# Patient Record
Sex: Male | Born: 1970 | Race: White | Hispanic: No | Marital: Married | Smoking: Never smoker
Health system: Southern US, Community
[De-identification: ages and names within clinical notes are randomized; demographics above are authoritative.]

## PROBLEM LIST (undated history)

## (undated) DIAGNOSIS — K219 Gastro-esophageal reflux disease without esophagitis: Secondary | ICD-10-CM

## (undated) DIAGNOSIS — F419 Anxiety disorder, unspecified: Secondary | ICD-10-CM

## (undated) DIAGNOSIS — F32A Depression, unspecified: Secondary | ICD-10-CM

## (undated) DIAGNOSIS — R112 Nausea with vomiting, unspecified: Secondary | ICD-10-CM

## (undated) DIAGNOSIS — E119 Type 2 diabetes mellitus without complications: Secondary | ICD-10-CM

## (undated) DIAGNOSIS — R519 Headache, unspecified: Secondary | ICD-10-CM

## (undated) DIAGNOSIS — G4730 Sleep apnea, unspecified: Secondary | ICD-10-CM

## (undated) DIAGNOSIS — G8929 Other chronic pain: Secondary | ICD-10-CM

## (undated) DIAGNOSIS — T8859XA Other complications of anesthesia, initial encounter: Secondary | ICD-10-CM

## (undated) DIAGNOSIS — F329 Major depressive disorder, single episode, unspecified: Secondary | ICD-10-CM

## (undated) DIAGNOSIS — Z9889 Other specified postprocedural states: Secondary | ICD-10-CM

## (undated) DIAGNOSIS — M549 Dorsalgia, unspecified: Secondary | ICD-10-CM

## (undated) DIAGNOSIS — T4145XA Adverse effect of unspecified anesthetic, initial encounter: Secondary | ICD-10-CM

## (undated) DIAGNOSIS — R51 Headache: Secondary | ICD-10-CM

## (undated) HISTORY — PX: COLONOSCOPY: SHX174

## (undated) HISTORY — PX: CHOLECYSTECTOMY: SHX55

## (undated) HISTORY — PX: KNEE SURGERY: SHX244

## (undated) HISTORY — PX: WISDOM TOOTH EXTRACTION: SHX21

---

## 2016-10-02 ENCOUNTER — Encounter (HOSPITAL_BASED_OUTPATIENT_CLINIC_OR_DEPARTMENT_OTHER): Payer: Self-pay | Admitting: *Deleted

## 2016-10-02 ENCOUNTER — Emergency Department (HOSPITAL_BASED_OUTPATIENT_CLINIC_OR_DEPARTMENT_OTHER): Payer: Managed Care, Other (non HMO)

## 2016-10-02 ENCOUNTER — Inpatient Hospital Stay (HOSPITAL_BASED_OUTPATIENT_CLINIC_OR_DEPARTMENT_OTHER)
Admission: EM | Admit: 2016-10-02 | Discharge: 2016-10-05 | DRG: 103 | Disposition: A | Discharge status: Home / Self Care | Payer: Managed Care, Other (non HMO) | Attending: Family Medicine | Admitting: Family Medicine

## 2016-10-02 DIAGNOSIS — I10 Essential (primary) hypertension: Secondary | ICD-10-CM | POA: Diagnosis present

## 2016-10-02 DIAGNOSIS — G43909 Migraine, unspecified, not intractable, without status migrainosus: Secondary | ICD-10-CM | POA: Diagnosis not present

## 2016-10-02 DIAGNOSIS — E781 Pure hyperglyceridemia: Secondary | ICD-10-CM | POA: Diagnosis present

## 2016-10-02 DIAGNOSIS — R609 Edema, unspecified: Secondary | ICD-10-CM

## 2016-10-02 DIAGNOSIS — R600 Localized edema: Secondary | ICD-10-CM | POA: Diagnosis present

## 2016-10-02 DIAGNOSIS — G459 Transient cerebral ischemic attack, unspecified: Secondary | ICD-10-CM

## 2016-10-02 DIAGNOSIS — E876 Hypokalemia: Secondary | ICD-10-CM | POA: Diagnosis present

## 2016-10-02 DIAGNOSIS — E662 Morbid (severe) obesity with alveolar hypoventilation: Secondary | ICD-10-CM | POA: Diagnosis present

## 2016-10-02 DIAGNOSIS — R29898 Other symptoms and signs involving the musculoskeletal system: Secondary | ICD-10-CM

## 2016-10-02 DIAGNOSIS — R030 Elevated blood-pressure reading, without diagnosis of hypertension: Secondary | ICD-10-CM

## 2016-10-02 DIAGNOSIS — E1165 Type 2 diabetes mellitus with hyperglycemia: Secondary | ICD-10-CM | POA: Diagnosis present

## 2016-10-02 DIAGNOSIS — M6281 Muscle weakness (generalized): Secondary | ICD-10-CM

## 2016-10-02 DIAGNOSIS — R29701 NIHSS score 1: Secondary | ICD-10-CM | POA: Diagnosis present

## 2016-10-02 DIAGNOSIS — I639 Cerebral infarction, unspecified: Secondary | ICD-10-CM | POA: Diagnosis present

## 2016-10-02 DIAGNOSIS — Z8249 Family history of ischemic heart disease and other diseases of the circulatory system: Secondary | ICD-10-CM

## 2016-10-02 DIAGNOSIS — R4781 Slurred speech: Secondary | ICD-10-CM | POA: Diagnosis present

## 2016-10-02 DIAGNOSIS — Z6841 Body Mass Index (BMI) 40.0 and over, adult: Secondary | ICD-10-CM

## 2016-10-02 DIAGNOSIS — R739 Hyperglycemia, unspecified: Secondary | ICD-10-CM

## 2016-10-02 DIAGNOSIS — K219 Gastro-esophageal reflux disease without esophagitis: Secondary | ICD-10-CM | POA: Diagnosis present

## 2016-10-02 DIAGNOSIS — Z833 Family history of diabetes mellitus: Secondary | ICD-10-CM

## 2016-10-02 DIAGNOSIS — R079 Chest pain, unspecified: Secondary | ICD-10-CM

## 2016-10-02 HISTORY — DX: Morbid (severe) obesity due to excess calories: E66.01

## 2016-10-02 LAB — DIFFERENTIAL
BASOS PCT: 0 %
Basophils Absolute: 0 10*3/uL (ref 0.0–0.1)
EOS ABS: 0.1 10*3/uL (ref 0.0–0.7)
Eosinophils Relative: 1 %
Lymphocytes Relative: 27 %
Lymphs Abs: 2.5 10*3/uL (ref 0.7–4.0)
MONO ABS: 0.7 10*3/uL (ref 0.1–1.0)
MONOS PCT: 8 %
Neutro Abs: 6 10*3/uL (ref 1.7–7.7)
Neutrophils Relative %: 64 %

## 2016-10-02 LAB — PROTIME-INR
INR: 1.02
PROTHROMBIN TIME: 13.4 s (ref 11.4–15.2)

## 2016-10-02 LAB — COMPREHENSIVE METABOLIC PANEL
ALBUMIN: 4 g/dL (ref 3.5–5.0)
ALK PHOS: 67 U/L (ref 38–126)
ALT: 47 U/L (ref 17–63)
ANION GAP: 8 (ref 5–15)
AST: 36 U/L (ref 15–41)
BILIRUBIN TOTAL: 0.9 mg/dL (ref 0.3–1.2)
BUN: 15 mg/dL (ref 6–20)
CALCIUM: 8.7 mg/dL — AB (ref 8.9–10.3)
CO2: 28 mmol/L (ref 22–32)
Chloride: 101 mmol/L (ref 101–111)
Creatinine, Ser: 1.09 mg/dL (ref 0.61–1.24)
GFR calc non Af Amer: >60 mL/min (ref >60)
Glucose, Bld: 269 mg/dL — ABNORMAL HIGH (ref 65–99)
Potassium: 3.2 mmol/L — ABNORMAL LOW (ref 3.5–5.1)
SODIUM: 137 mmol/L (ref 135–145)
TOTAL PROTEIN: 8 g/dL (ref 6.5–8.1)

## 2016-10-02 LAB — CBC
HEMATOCRIT: 43.9 % (ref 39.0–52.0)
HEMOGLOBIN: 15.4 g/dL (ref 13.0–17.0)
MCH: 30.6 pg (ref 26.0–34.0)
MCHC: 35.1 g/dL (ref 30.0–36.0)
MCV: 87.3 fL (ref 78.0–100.0)
Platelets: 209 10*3/uL (ref 150–400)
RBC: 5.03 MIL/uL (ref 4.22–5.81)
RDW: 12.4 % (ref 11.5–15.5)
WBC: 9.4 10*3/uL (ref 4.0–10.5)

## 2016-10-02 LAB — ETHANOL: Alcohol, Ethyl (B): <5 mg/dL (ref <5)

## 2016-10-02 LAB — APTT: aPTT: 28 seconds (ref 24–36)

## 2016-10-02 LAB — TROPONIN I

## 2016-10-02 LAB — CBG MONITORING, ED: GLUCOSE-CAPILLARY: 246 mg/dL — AB (ref 65–99)

## 2016-10-02 MED ORDER — NITROGLYCERIN 0.4 MG SL SUBL: 0.4000 mg | SUBLINGUAL_TABLET | SUBLINGUAL | Status: DC | PRN

## 2016-10-02 MED ORDER — ASPIRIN 81 MG PO CHEW
324.0000 mg | CHEWABLE_TABLET | Freq: Once | ORAL | Status: AC
  Administered 2016-10-03: 324 mg via ORAL
  Filled 2016-10-02: qty 4

## 2016-10-02 NOTE — ED Notes (Addendum)
Patient transported to CT with nurse transport on monitor.

## 2016-10-02 NOTE — ED Triage Notes (Signed)
Left arm weakness and numbness as well as "pt being out of it and not himself and some slurred speech".  Wife states that pt was normal at 8pm and when she returned at 8:30 he appeared out of it and his speech was slurred and he had weakness and numbness in his left arm.  Pt is alert and oriented with weakness in left arm

## 2016-10-02 NOTE — ED Notes (Signed)
Patient transported to CT 

## 2016-10-02 NOTE — ED Notes (Signed)
Code stroke called now by Dr. Clayborne DanaMesner.

## 2016-10-02 NOTE — Consult Note (Signed)
Admission H&P    Chief Complaint: Weakness and numbness of left upper extremity.  HPI: Ernest Franco is an 45 y.o. male with a history of hypertension and obesity presenting with new onset weakness and numbness of left forearm and hand of sudden onset. He was last known well at 10:00 PM tonight. He has no previous history of stroke nor TIA. He has not been on antiplatelet therapy. No facial droop has been described and speech is been unchanged. He said no symptoms involving left side of his face nor left lower extremity. CT scan of his head showed no acute intracranial abnormality. NIH stroke score was 1 with numbness involving left forearm and hand. There was no drift of his left upper extremity, but he appeared to have distal left upper extremity weakness.  LSN: 10:00 PM on 10/02/2016 tPA Given: No: Minimal deficits. mRankin:  Past Medical History:  Diagnosis Date  . Hypertension   . Obesity     Past Surgical History:  Procedure Laterality Date  . CHOLECYSTECTOMY    . KNEE SURGERY      No family history on file. Social History:  reports that he has never smoked. He has never used smokeless tobacco. He reports that he does not drink alcohol or use drugs.  Allergies: No Known Allergies  Medications: Preadmission medications were reviewed by me.  ROS: History obtained from the patient  General ROS: negative for - chills, fatigue, fever, night sweats, weight gain or weight loss Psychological ROS: negative for - behavioral disorder, hallucinations, memory difficulties, mood swings or suicidal ideation Ophthalmic ROS: negative for - blurry vision, double vision, eye pain or loss of vision ENT ROS: negative for - epistaxis, nasal discharge, oral lesions, sore throat, tinnitus or vertigo Allergy and Immunology ROS: negative for - hives or itchy/watery eyes Hematological and Lymphatic ROS: negative for - bleeding problems, bruising or swollen lymph nodes Endocrine ROS: negative for -  galactorrhea, hair pattern changes, polydipsia/polyuria or temperature intolerance Respiratory ROS: negative for - cough, hemoptysis, shortness of breath or wheezing Cardiovascular ROS: negative for - chest pain, dyspnea on exertion, edema or irregular heartbeat Gastrointestinal ROS: negative for - abdominal pain, diarrhea, hematemesis, nausea/vomiting or stool incontinence Genito-Urinary ROS: negative for - dysuria, hematuria, incontinence or urinary frequency/urgency Musculoskeletal ROS: negative for - joint swelling or muscular weakness Neurological ROS: as noted in HPI Dermatological ROS: negative for rash and skin lesion changes  Physical Examination: Blood pressure 101/56, pulse 79, temperature 98 F (36.7 C), temperature source Oral, resp. rate 11, height 5' 6"  (1.676 m), weight (!) 172.4 kg (380 lb), SpO2 100 %.  HEENT-  Normocephalic, no lesions, without obvious abnormality.  Normal external eye and conjunctiva.  Normal TM's bilaterally.  Normal auditory canals and external ears. Normal external nose, mucus membranes and septum.  Normal pharynx. Neck supple with no masses, nodes, nodules or enlargement. Cardiovascular - regular rate and rhythm, S1, S2 normal, no murmur, click, rub or gallop Lungs - chest clear, no wheezing, rales, normal symmetric air entry Abdomen - soft, non-tender; bowel sounds normal; no masses,  no organomegaly Extremities - no joint deformities, effusion, or inflammation  Neurologic Examination: Mental Status: Alert, oriented, no acute distress.  Speech fluent without evidence of aphasia. Able to follow commands without difficulty. Cranial Nerves: II-Visual fields were normal. III/IV/VI-Pupils were equal and reacted normally to light. Extraocular movements were full and conjugate.    V/VII-no facial numbness and no facial weakness. VIII-normal. X-normal speech and symmetrical palatal movement. XI: trapezius strength/neck  flexion strength normal  bilaterally XII-midline tongue extension with normal strength. Motor: 5/5 bilaterally with normal tone and bulk, including no drift of left upper extremity; strength of distal left upper extremity was difficult to assess because of possible poor effort versus moderately severe weakness. Sensory: Reduced perception of tactile sensation over left forearm and hand compared to the distal right upper extremity. Deep Tendon Reflexes: 1+ and symmetric. Plantars: Flexor bilaterally Cerebellar: Normal finger-to-nose testing. Carotid auscultation: Normal  Results for orders placed or performed during the hospital encounter of 10/02/16 (from the past 48 hour(s))  Troponin I     Status: None   Collection Time: 10/02/16 10:04 PM  Result Value Ref Range   Troponin I <0.03 <0.03 ng/mL  CBC     Status: None   Collection Time: 10/02/16 10:04 PM  Result Value Ref Range   WBC 9.4 4.0 - 10.5 K/uL   RBC 5.03 4.22 - 5.81 MIL/uL   Hemoglobin 15.4 13.0 - 17.0 g/dL   HCT 43.9 39.0 - 52.0 %   MCV 87.3 78.0 - 100.0 fL   MCH 30.6 26.0 - 34.0 pg   MCHC 35.1 30.0 - 36.0 g/dL   RDW 12.4 11.5 - 15.5 %   Platelets 209 150 - 400 K/uL  Comprehensive metabolic panel     Status: Abnormal   Collection Time: 10/02/16 10:04 PM  Result Value Ref Range   Sodium 137 135 - 145 mmol/L   Potassium 3.2 (L) 3.5 - 5.1 mmol/L   Chloride 101 101 - 111 mmol/L   CO2 28 22 - 32 mmol/L   Glucose, Bld 269 (H) 65 - 99 mg/dL   BUN 15 6 - 20 mg/dL   Creatinine, Ser 1.09 0.61 - 1.24 mg/dL   Calcium 8.7 (L) 8.9 - 10.3 mg/dL   Total Protein 8.0 6.5 - 8.1 g/dL   Albumin 4.0 3.5 - 5.0 g/dL   AST 36 15 - 41 U/L   ALT 47 17 - 63 U/L   Alkaline Phosphatase 67 38 - 126 U/L   Total Bilirubin 0.9 0.3 - 1.2 mg/dL   GFR calc non Af Amer >60 >60 mL/min   GFR calc Af Amer >60 >60 mL/min    Comment: (NOTE) The eGFR has been calculated using the CKD EPI equation. This calculation has not been validated in all clinical situations. eGFR's  persistently <60 mL/min signify possible Chronic Kidney Disease.    Anion gap 8 5 - 15  Ethanol     Status: None   Collection Time: 10/02/16 10:04 PM  Result Value Ref Range   Alcohol, Ethyl (B) <5 <5 mg/dL    Comment:        LOWEST DETECTABLE LIMIT FOR SERUM ALCOHOL IS 5 mg/dL FOR MEDICAL PURPOSES ONLY   Protime-INR     Status: None   Collection Time: 10/02/16 10:04 PM  Result Value Ref Range   Prothrombin Time 13.4 11.4 - 15.2 seconds   INR 1.02   APTT     Status: None   Collection Time: 10/02/16 10:04 PM  Result Value Ref Range   aPTT 28 24 - 36 seconds  Differential     Status: None   Collection Time: 10/02/16 10:04 PM  Result Value Ref Range   Neutrophils Relative % 64 %   Neutro Abs 6.0 1.7 - 7.7 K/uL   Lymphocytes Relative 27 %   Lymphs Abs 2.5 0.7 - 4.0 K/uL   Monocytes Relative 8 %   Monocytes Absolute 0.7 0.1 -  1.0 K/uL   Eosinophils Relative 1 %   Eosinophils Absolute 0.1 0.0 - 0.7 K/uL   Basophils Relative 0 %   Basophils Absolute 0.0 0.0 - 0.1 K/uL  POC CBG, ED     Status: Abnormal   Collection Time: 10/02/16 10:06 PM  Result Value Ref Range   Glucose-Capillary 246 (H) 65 - 99 mg/dL   Ct Head Wo Contrast  Result Date: 10/02/2016 CLINICAL DATA:  Left-sided weakness and slurred speech for 2.3 hours. EXAM: CT HEAD WITHOUT CONTRAST TECHNIQUE: Contiguous axial images were obtained from the base of the skull through the vertex without intravenous contrast. COMPARISON:  None. FINDINGS: Brain: No evidence of acute infarction, hemorrhage, hydrocephalus, extra-axial collection or mass lesion/mass effect. Vascular: No hyperdense vessel or unexpected calcification. Skull: Normal. Negative for fracture or focal lesion. Sinuses/Orbits: No acute finding. Other: None. IMPRESSION: No acute intracranial abnormality. Acute infarcts may be CT occult in the first 24 hours. These results were called by telephone at the time of interpretation on 10/02/2016 at 10:32 pm to Dr. Merrily Pew , who verbally acknowledged these results. Electronically Signed   By: Ashley Royalty M.D.   On: 10/02/2016 22:32    Assessment: 45 y.o. male with multiple risk factors for stroke presenting with possible TIA or small vessel right MCA territory ischemic infarction. There may be psychophysiologic factors contributing to this patient's symptomatology, as well.  Stroke Risk Factors - diabetes mellitus and hypertension  Plan: 1. HgbA1c, fasting lipid panel 2. MRI, MRA  of the brain without contrast 3. PT consult, OT consult, Speech consult 4. Echocardiogram 5. Carotid dopplers 6. Prophylactic therapy-Antiplatelet med: Aspirin  7. Risk factor modification 8. Telemetry monitoring 9. Hypercoagulopathy panel  C.R. Nicole Kindred, MD [facility] [phone]  10/02/2016, 11:27 PM

## 2016-10-02 NOTE — ED Notes (Signed)
Pt arrived to ED with report of URI and CP, at beginning of triage found that pt had left sided hand weakness and numbness which was first noticed at 20:30, LSN 2000.  Some slurred speech.  EDP to room for eval.  Pt taken to CT and then EDP notifies that [facility]rolog[facility].  No order for TPA at this time.  Pt has 2 IV in place.  [facility] at bedside

## 2016-10-02 NOTE — ED Triage Notes (Signed)
Received patient via [facility], Dr. Roseanne RenoStewart neurology at bedside at this time.

## 2016-10-02 NOTE — ED Provider Notes (Signed)
[facility] Provider Note   CSN: [id number] Arrival date & time: 10/02/16  2156  By signing my name below, I, Levon Hedger, attest that this documentation has been prepared under the direction and in the presence of Marily Memos, MD . Electronically Signed: Levon Hedger, Scribe. 10/02/2016. 10:17 PM.   History   Chief Complaint Chief Complaint  Patient presents with  . Weakness   HPI Ernest Franco is a left hand dominant 45 y.o. male with hx of HTN who presents to the Emergency Department complaining of sudden onset left sided weakness tonight PTA. He was last seen normal at 8 pm, and when she returned at 8:30 he was experiencing symptoms. Wife and pt note associated slurred speech, left sided numbness, chest pain, and chills. He has never had anything like this before. No hx of DM, MI, or TIA. He is not on any blood thinners. No recent head surgery. No alcohol or drug use today. Pt is a nonsmoker.  The history is provided by the patient and the spouse. No language interpreter was used.    Past Medical History:  Diagnosis Date  . Hypertension   . Obesity     There are no active problems to display for this patient.   Past Surgical History:  Procedure Laterality Date  . CHOLECYSTECTOMY    . KNEE SURGERY         Home Medications    Prior to Admission medications   Not on File    Family History No family history on file.  Social History Social History  Substance Use Topics  . Smoking status: Never Smoker  . Smokeless tobacco: Never Used  . Alcohol use No     Allergies   Review of patient's allergies indicates no known allergies.  Review of Systems Review of Systems  Constitutional: Positive for chills.  Neurological: Positive for facial asymmetry, speech difficulty and numbness.  Hematological: Does not bruise/bleed easily.  All other systems reviewed and are negative.  Physical Exam Updated Vital Signs BP 101/56 (BP Location: Right Arm)    Pulse 79   Temp 98 F (36.7 C) (Oral)   Resp 11   Ht 5\' 6"  (1.676 m)   Wt (!) 380 lb (172.4 kg)   SpO2 100%   BMI 61.33 kg/m   Physical Exam  Constitutional: He is oriented to person, place, and time. He appears well-developed and well-nourished. No distress.  HENT:  Head: Normocephalic and atraumatic.  Eyes: Conjunctivae are normal.  Cardiovascular: Normal rate and regular rhythm.  Exam reveals no friction rub.   No murmur heard. Pulmonary/Chest: Effort normal. He has no wheezes. He has no rales.  Abdominal: He exhibits no distension.  Neurological: He is alert and oriented to person, place, and time.  Normal strength and sensation to lower extremities and right arm. Left arm flaccid. Slight left droop of smile; per family he has slurred speech   Skin: Skin is warm and dry.  Psychiatric: He has a normal mood and affect.  Nursing note and vitals reviewed.  ED Treatments / Results  DIAGNOSTIC STUDIES:  Oxygen Saturation is 100% on RA, normal by my interpretation.    COORDINATION OF CARE:  10:09 PM Discussed treatment plan with pt at bedside and pt agreed to plan.   Labs (all labs ordered are listed, but only abnormal results are displayed) Labs Reviewed  COMPREHENSIVE METABOLIC PANEL - Abnormal; Notable for the following:       Result Value   Potassium 3.2 (*)  Glucose, Bld 269 (*)    Calcium 8.7 (*)    All other components within normal limits  CBG MONITORING, ED - Abnormal; Notable for the following:    Glucose-Capillary 246 (*)    All other components within normal limits  TROPONIN I  CBC  ETHANOL  PROTIME-INR  APTT  DIFFERENTIAL  URINE RAPID DRUG SCREEN, HOSP PERFORMED  URINALYSIS, ROUTINE W REFLEX MICROSCOPIC (NOT AT [facility])    EKG  EKG Interpretation  Date/Time:  Tuesday October 02 2016 22:10:14 EDT Ventricular Rate:  84 PR Interval:    QRS Duration: 104 QT Interval:  382 QTC Calculation: 452 R Axis:   65 Text Interpretation:  Sinus rhythm ST  elev, probable normal early repol pattern No acute changes No significant change since last tracing Confirmed by Rhunette CroftNANAVATI, MD, Janey GentaANKIT [phone] on 10/02/2016 11:06:28 PM      Radiology Ct Head Wo Contrast  Result Date: 10/02/2016 CLINICAL DATA:  Left-sided weakness and slurred speech for 2.3 hours. EXAM: CT HEAD WITHOUT CONTRAST TECHNIQUE: Contiguous axial images were obtained from the base of the skull through the vertex without intravenous contrast. COMPARISON:  None. FINDINGS: Brain: No evidence of acute infarction, hemorrhage, hydrocephalus, extra-axial collection or mass lesion/mass effect. Vascular: No hyperdense vessel or unexpected calcification. Skull: Normal. Negative for fracture or focal lesion. Sinuses/Orbits: No acute finding. Other: None. IMPRESSION: No acute intracranial abnormality. Acute infarcts may be CT occult in the first 24 hours. These results were called by telephone at the time of interpretation on 10/02/2016 at 10:32 pm to Dr. Marily MemosJASON Aja Bolander , who verbally acknowledged these results. Electronically Signed   By: Tollie Ethavid  Kwon M.D.   On: 10/02/2016 22:32    Procedures Procedures (including critical care time)  CRITICAL CARE Performed by: Marily MemosMesner, Tarik Teixeira Total critical care time: 35 minutes Critical care time was exclusive of separately billable procedures and treating other patients. Critical care was necessary to treat or prevent imminent or life-threatening deterioration. Critical care was time spent personally by me on the following activities: development of treatment plan with patient and/or surrogate as well as nursing, discussions with consultants, evaluation of patient's response to treatment, examination of patient, obtaining history from patient or surrogate, ordering and performing treatments and interventions, ordering and review of laboratory studies, ordering and review of radiographic studies, pulse oximetry and re-evaluation of patient's condition.   Medications  Ordered in ED Medications - No data to display   Initial Impression / Assessment and Plan / ED Course  I have reviewed the triage vital signs and the nursing notes.  Pertinent labs & imaging results that were available during my care of the patient were reviewed by me and considered in my medical decision making (see chart for details).  Clinical Course   45 yo M w/ acute onset of left handed weakness at 2030, LKW at 502000. No h/o same. Slurred speech per family. Slight facial droop on my exam.  Code stroke activated. [facility] and no tPA given until [facility], [facility] transported quickly.   Final Clinical Impressions(s) / ED Diagnoses   Final diagnoses:  Weakness of left upper extremity   New Prescriptions New Prescriptions   No medications on file   I personally performed the services described in this documentation, which was scribed in my presence. The recorded information has been reviewed and is accurate.     Marily MemosJason Danitra Payano, MD 10/02/16 [phone]

## 2016-10-02 NOTE — ED Notes (Signed)
[facility] arrived to this facility.

## 2016-10-02 NOTE — ED Notes (Signed)
MD at bedside. 

## 2016-10-02 NOTE — ED Notes (Signed)
[facility] leaving facility at this time.

## 2016-10-03 ENCOUNTER — Observation Stay (HOSPITAL_COMMUNITY): Payer: Managed Care, Other (non HMO)

## 2016-10-03 ENCOUNTER — Observation Stay (HOSPITAL_BASED_OUTPATIENT_CLINIC_OR_DEPARTMENT_OTHER): Payer: Managed Care, Other (non HMO)

## 2016-10-03 ENCOUNTER — Encounter (HOSPITAL_COMMUNITY): Payer: Self-pay | Admitting: Nurse Practitioner

## 2016-10-03 DIAGNOSIS — R079 Chest pain, unspecified: Secondary | ICD-10-CM | POA: Diagnosis not present

## 2016-10-03 DIAGNOSIS — G458 Other transient cerebral ischemic attacks and related syndromes: Secondary | ICD-10-CM

## 2016-10-03 DIAGNOSIS — R609 Edema, unspecified: Secondary | ICD-10-CM | POA: Diagnosis not present

## 2016-10-03 DIAGNOSIS — R739 Hyperglycemia, unspecified: Secondary | ICD-10-CM

## 2016-10-03 DIAGNOSIS — R030 Elevated blood-pressure reading, without diagnosis of hypertension: Secondary | ICD-10-CM | POA: Diagnosis not present

## 2016-10-03 DIAGNOSIS — M6281 Muscle weakness (generalized): Secondary | ICD-10-CM

## 2016-10-03 DIAGNOSIS — G459 Transient cerebral ischemic attack, unspecified: Secondary | ICD-10-CM | POA: Diagnosis not present

## 2016-10-03 LAB — VAS US CAROTID
LCCADDIAS: 18 cm/s
LCCAPSYS: 119 cm/s
LEFT ECA DIAS: -17 cm/s
LEFT VERTEBRAL DIAS: -10 cm/s
Left CCA dist sys: 87 cm/s
Left CCA prox dias: 25 cm/s
Left ICA dist dias: -26 cm/s
Left ICA dist sys: -67 cm/s
Left ICA prox dias: -25 cm/s
Left ICA prox sys: -104 cm/s
RCCADSYS: -59 cm/s
RCCAPDIAS: 18 cm/s
RIGHT ECA DIAS: -13 cm/s
RIGHT VERTEBRAL DIAS: -5 cm/s
Right CCA prox sys: 111 cm/s

## 2016-10-03 LAB — LIPID PANEL
Cholesterol: 166 mg/dL (ref 0–200)
HDL: 25 mg/dL — AB (ref >40)
LDL CALC: 98 mg/dL (ref 0–99)
TRIGLYCERIDES: 216 mg/dL — AB (ref <150)
Total CHOL/HDL Ratio: 6.6 RATIO
VLDL: 43 mg/dL — AB (ref 0–40)

## 2016-10-03 LAB — RAPID URINE DRUG SCREEN, HOSP PERFORMED
Amphetamines: NOT DETECTED
BARBITURATES: NOT DETECTED
Benzodiazepines: NOT DETECTED
COCAINE: NOT DETECTED
OPIATES: NOT DETECTED
TETRAHYDROCANNABINOL: NOT DETECTED

## 2016-10-03 LAB — GLUCOSE, CAPILLARY
GLUCOSE-CAPILLARY: 246 mg/dL — AB (ref 65–99)
GLUCOSE-CAPILLARY: 252 mg/dL — AB (ref 65–99)
Glucose-Capillary: 232 mg/dL — ABNORMAL HIGH (ref 65–99)
Glucose-Capillary: 196 mg/dL — ABNORMAL HIGH (ref 65–99)

## 2016-10-03 LAB — TROPONIN I

## 2016-10-03 LAB — ECHOCARDIOGRAM COMPLETE
Height: 66 in
Weight: 6080 oz

## 2016-10-03 LAB — ANTITHROMBIN III: AntiThromb III Func: 86 % (ref 75–120)

## 2016-10-03 MED ORDER — ASPIRIN 300 MG RE SUPP: 300.0000 mg | Freq: Every day | RECTAL | Status: DC

## 2016-10-03 MED ORDER — LORAZEPAM 2 MG/ML IJ SOLN
0.5000 mg | Freq: Once | INTRAMUSCULAR | Status: AC
  Administered 2016-10-03: 0.5 mg via INTRAVENOUS
  Filled 2016-10-03: qty 1

## 2016-10-03 MED ORDER — FUROSEMIDE 20 MG PO TABS
20.0000 mg | ORAL_TABLET | Freq: Two times a day (BID) | ORAL | Status: DC
  Administered 2016-10-03 – 2016-10-05 (×5): 20 mg via ORAL
  Filled 2016-10-03 (×5): qty 1

## 2016-10-03 MED ORDER — ATORVASTATIN CALCIUM 10 MG PO TABS
10.0000 mg | ORAL_TABLET | Freq: Every day | ORAL | Status: DC
  Administered 2016-10-03 – 2016-10-04 (×2): 10 mg via ORAL
  Filled 2016-10-03 (×2): qty 1

## 2016-10-03 MED ORDER — POTASSIUM CHLORIDE CRYS ER 20 MEQ PO TBCR
40.0000 meq | EXTENDED_RELEASE_TABLET | Freq: Every day | ORAL | Status: DC
  Administered 2016-10-03 – 2016-10-05 (×3): 40 meq via ORAL
  Filled 2016-10-03 (×3): qty 2

## 2016-10-03 MED ORDER — ASPIRIN 325 MG PO TABS
325.0000 mg | ORAL_TABLET | Freq: Every day | ORAL | Status: DC
  Administered 2016-10-03 – 2016-10-05 (×3): 325 mg via ORAL
  Filled 2016-10-03 (×3): qty 1

## 2016-10-03 MED ORDER — IOPAMIDOL (ISOVUE-370) INJECTION 76%
INTRAVENOUS | Status: AC
  Administered 2016-10-03: 50 mL
  Filled 2016-10-03: qty 50

## 2016-10-03 MED ORDER — MORPHINE SULFATE (PF) 2 MG/ML IV SOLN: 2.0000 mg | INTRAVENOUS | Status: DC | PRN

## 2016-10-03 MED ORDER — INSULIN ASPART 100 UNIT/ML ~~LOC~~ SOLN
0.0000 [IU] | Freq: Three times a day (TID) | SUBCUTANEOUS | Status: DC
  Administered 2016-10-03: 2 [IU] via SUBCUTANEOUS
  Administered 2016-10-03 – 2016-10-04 (×3): 3 [IU] via SUBCUTANEOUS
  Administered 2016-10-04: 2 [IU] via SUBCUTANEOUS
  Administered 2016-10-04: 3 [IU] via SUBCUTANEOUS
  Administered 2016-10-05: 2 [IU] via SUBCUTANEOUS

## 2016-10-03 MED ORDER — PANTOPRAZOLE SODIUM 40 MG PO TBEC
40.0000 mg | DELAYED_RELEASE_TABLET | Freq: Every day | ORAL | Status: DC
  Administered 2016-10-03 – 2016-10-05 (×3): 40 mg via ORAL
  Filled 2016-10-03 (×3): qty 1

## 2016-10-03 MED ORDER — ACETAMINOPHEN 500 MG PO TABS
1000.0000 mg | ORAL_TABLET | Freq: Once | ORAL | Status: AC
  Administered 2016-10-03: 1000 mg via ORAL
  Filled 2016-10-03: qty 2

## 2016-10-03 MED ORDER — HYDROCODONE-ACETAMINOPHEN 5-325 MG PO TABS
1.0000 | ORAL_TABLET | Freq: Four times a day (QID) | ORAL | Status: DC | PRN
  Administered 2016-10-03: 2 via ORAL
  Administered 2016-10-03: 1 via ORAL
  Administered 2016-10-04 – 2016-10-05 (×3): 2 via ORAL
  Filled 2016-10-03 (×2): qty 2
  Filled 2016-10-03: qty 1
  Filled 2016-10-03 (×2): qty 2

## 2016-10-03 MED ORDER — PERFLUTREN LIPID MICROSPHERE
1.0000 mL | INTRAVENOUS | Status: AC | PRN
  Administered 2016-10-03: 3 mL via INTRAVENOUS
  Filled 2016-10-03: qty 10

## 2016-10-03 MED ORDER — ACETAMINOPHEN 325 MG PO TABS
650.0000 mg | ORAL_TABLET | ORAL | Status: DC | PRN
  Filled 2016-10-03: qty 2

## 2016-10-03 MED ORDER — ACETAMINOPHEN 650 MG RE SUPP: 650.0000 mg | RECTAL | Status: DC | PRN

## 2016-10-03 NOTE — Consult Note (Signed)
CARDIOLOGY CONSULT NOTE   Patient ID: Ernest Franco MRN: [id number], DOB/AGE: [birth year]   Admit date: 10/02/2016 Date of Consult: 10/03/2016   Primary Physician: Cheral Bay, MD Primary Cardiologist: new  Pt. Profile  Ernest Franco is is a morbidly obese 45 year old Caucasian male with no significant past medical history or cardiac history presented with left arm weakness, slurred speech and facial drooping since the night of 10/3. He also complained of intermittent chest discomfort for the past several month period  Problem List  Past Medical History:  Diagnosis Date  . Essential hypertension   . Morbid obesity (HCC)     Past Surgical History:  Procedure Laterality Date  . CHOLECYSTECTOMY    . KNEE SURGERY       Allergies  No Known Allergies  HPI   Ernest Franco is is a morbidly obese 45 year old Caucasian male with no significant past medical history or cardiac history. He does have family history of early CAD with his mother having multiple stents since age 49. No other members of family have early CAD. His mother also has diabetes. His father is still living and currently healthy without any issues. He says he did have a stress test over 15 years ago, he does not know the result. He has not seen a cardiologist.   According to the patient, he has had several months of intermittent chest discomfort, however no clear correlation with exertion. Again happen at rest or with exertion, typically last up to 5 minutes each. He does not know any obvious exacerbating or alleviating factors. He described with the chest discomfort as a pressure-like sensation radiating up to his left shoulder. The chest discomfort may associate with shortness of breath, or diaphoresis, however not consistently either. He says he has not told his PCP regarding this and is waiting for his next visit to discuss this further.  In the afternoon of 10/02/2016, he was able to move 150 pound object with his son without  any chest discomfort. He did have some shortness of breath afterward. In the night of 10/3, while sitting down with his son, he started noticing some left arm weakness. Family member also noticed slurring of speech and facial drooping. He sought medical attention and medicine or [place] and was subsequently transferred to [facility] due to concern of TIA/stroke. So far he has been evaluated by neurology/stroke team. CT of head without contrast was negative. CT of brain with contrast is currently pending. Echocardiogram obtained today showed normal EF, the study was poor due to his body size. There is no mention of wall motion abnormality. EKG was normal without significant ST-T wave changes.  Inpatient Medications  . aspirin  300 mg Rectal Daily   Or  . aspirin  325 mg Oral Daily  . atorvastatin  10 mg Oral q1800  . furosemide  20 mg Oral BID  . insulin aspart  0-9 Units Subcutaneous TID WC  . pantoprazole  40 mg Oral Daily  . potassium chloride  40 mEq Oral Daily    Family History No family history on file.   Social History Social History   Social History  . Marital status: Married    Spouse name: N/A  . Number of children: N/A  . Years of education: N/A   Occupational History  . Not on file.   Social History Main Topics  . Smoking status: Never Smoker  . Smokeless tobacco: Never Used  . Alcohol use No  . Drug use: No  .  Sexual activity: Not on file   Other Topics Concern  . Not on file   Social History Narrative  . No narrative on file     Review of Systems  General:  No chills, fever, night sweats or weight changes.  Cardiovascular:  No dyspnea on exertion, edema, orthopnea, palpitations, paroxysmal nocturnal dyspnea. +chest pain Dermatological: No rash, lesions/masses Respiratory: No cough, dyspnea Urologic: No hematuria, dysuria Abdominal:   No nausea, vomiting, diarrhea, bright red blood per rectum, melena, or hematemesis Neurologic:  No visual  changes, wkns, changes in mental status. All other systems reviewed and are otherwise negative except as noted above.  Physical Exam  Blood pressure 135/64, pulse 96, temperature 98.3 F (36.8 C), temperature source Oral, resp. rate 18, height 5\' 6"  (1.676 m), weight (!) 380 lb (172.4 kg), SpO2 99 %.  General: Pleasant, NAD Psych: Normal affect. Neuro: Alert and oriented X 3. Moves all extremities spontaneously. HEENT: Normal  Neck: Supple without bruits or JVD. Lungs:  Resp regular and unlabored, CTA. Heart: RRR no s3, s4, or murmurs. Abdomen: Soft, non-tender, non-distended, BS + x 4.  Extremities: No clubbing, cyanosis. DP/PT/Radials 2+ and equal bilaterally. 2+ pitting edema bilaterally  Labs   Recent Labs  10/02/16 2204 10/03/16 0155 10/03/16 0715 10/03/16 1518  TROPONINI <0.03 <0.03 <0.03 <0.03   Lab Results  Component Value Date   WBC 9.4 10/02/2016   HGB 15.4 10/02/2016   HCT 43.9 10/02/2016   MCV 87.3 10/02/2016   PLT 209 10/02/2016    Recent Labs Lab 10/02/16 2204  NA 137  K 3.2*  CL 101  CO2 28  BUN 15  CREATININE 1.09  CALCIUM 8.7*  PROT 8.0  BILITOT 0.9  ALKPHOS 67  ALT 47  AST 36  GLUCOSE 269*   Lab Results  Component Value Date   CHOL 166 10/03/2016   HDL 25 (L) 10/03/2016   LDLCALC 98 10/03/2016   TRIG 216 (H) 10/03/2016   No results found for: DDIMER  Radiology/Studies  Dg Chest 1 View  Result Date: 10/03/2016 CLINICAL DATA:  45 year old male with a history of chest pain EXAM: CHEST 1 VIEW COMPARISON:  None. FINDINGS: The heart size and mediastinal contours are within normal limits. Both lungs are clear. The visualized skeletal structures are unremarkable. IMPRESSION: No radiographic evidence of acute cardiopulmonary disease. Signed, Yvone Neu. Loreta Ave, DO [facility] Electronically Signed   By: Gilmer Mor D.O.   On: 10/03/2016 15:23   Ct Head Wo Contrast  Result Date:  10/02/2016 CLINICAL DATA:  Left-sided weakness and slurred speech for 2.3 hours. EXAM: CT HEAD WITHOUT CONTRAST TECHNIQUE: Contiguous axial images were obtained from the base of the skull through the vertex without intravenous contrast. COMPARISON:  None. FINDINGS: Brain: No evidence of acute infarction, hemorrhage, hydrocephalus, extra-axial collection or mass lesion/mass effect. Vascular: No hyperdense vessel or unexpected calcification. Skull: Normal. Negative for fracture or focal lesion. Sinuses/Orbits: No acute finding. Other: None. IMPRESSION: No acute intracranial abnormality. Acute infarcts may be CT occult in the first 24 hours. These results were called by telephone at the time of interpretation on 10/02/2016 at 10:32 pm to Dr. Marily Memos , who verbally acknowledged these results. Electronically Signed   By: Tollie Eth M.D.   On: 10/02/2016 22:32    ECG  Normal sinus rhythm without significant ST-T wave changes  ASSESSMENT AND PLAN  1. Possible TIA: Had left arm weakness, slurring speech and facial drooping, symptom has improved.  No longer have facial drooping or slurring speech at this point. Initial CT of head was negative. Stroke service is following the patient.  2. Intermittent chest pain: No clear correlation with exertion, he would not benefit from stress test given his body size. Although stress MRI is possible to ignore his body's habitus, however we do not reduce stress MRI here, even so it may still interfere with the result. Options in this case including medical management versus cardiac catheterization. I think it is reasonable to pursue medical management initially by giving him low-dose Imdur and follow-up as outpatient. If he continued to have chest discomfort, then we will pursue cardiac catheterization to definitively assess his coronary anatomy later  3. Hypertriglyceridemia: Started on low-dose Lipitor.  4. Chronic bilateral lower extremity edema: 2+ pitting edema  bilaterally, per patient this is chronic. Echocardiogram is reassuring. Edema likely related to his body habitus.  5. Morbid obesity: Ultimately losing weight will improve his multiple conditions.   Ramond DialSigned, Hao Meng, PA-C 10/03/2016, 6:47 PM  Patient seen, examined. Available data reviewed. Agree with findings, assessment, and plan as outlined by Azalee CourseHao Meng, PA-C.  The patient is examined.  He is a morbidly obese male in no distress.  Lungs are clear.  Heart is regular rate and rhythm.  Abdomen soft and nontender.  There are no carotid bruits.  There is no pretibial edema.  Troponins are negative and EKG is within normal limits.  The patient has chest pain with both typical and atypical features.  He certainly does not have chest discomfort consistently associated with exertion.  Stress testing will not be accurate because of his morbid obesity and I don't think there is any diagnostic utility and putting him through a nuclear scan.  His echo images are poor, but with echo contrast agent.  We are able to see that his LV function is normal.  I agree with plans for an initial trial of medical therapy with consideration for cardiac catheterization as a definitive study.  If he continues to have chest discomfort despite optimal medical therapy.  Tonny BollmanMichael Carle Fenech, M.D. 10/03/2016 7:11 PM

## 2016-10-03 NOTE — ED Notes (Signed)
Pt alert and oriented, [facility]. NIH originally 4, upon arrival here, NIH a 1 for sensory. Passed swallow screen.

## 2016-10-03 NOTE — Progress Notes (Signed)
PROGRESS NOTE    Ernest Franco  GNF:[id number] DOB: 04/20/1971 DOA: 10/02/2016 PCP: Cheral BayHAWKS,ALDENE N, MD   Brief Narrative:  Ernest Franco is a 45 y.o. gentleman with a history of chronic LE edema managed with lasix and acid reflux (he is protonix at home) who [facility] for acute onset left arm weakness with numbness that started around 8:30PM.  Patient was sitting on the couch talking to his son when he first noticed the symptoms.  He did not take anything in an attempt to alleviate his symptoms.  He actually ate dinner before heading to the ED [facility] for [facility].  Around 9PM, while en route to the ED, his wife noticed slurred speech.  The [facility] was tri[facility] and [facility] as a CODE STRO[facility].  The patient also reports a history of intermittent episodes of chest pain for the past several weeks.  The pain has occurred at rest and with exertion.  He describes it as a substernal heaviness that sometimes radiates into his neck or down his left arm and is associated with diaphoresis.  No significant nausea or vomiting.  No light-headness, dizziness, or syncope.  The pain typically lasts for several minutes before resolving on its own without specific intervention.  The patient was waiting to see his PCP at the end of the month for these symptoms.  He does not have active chest pain right now in the ED. No history of falls, head trauma, or LOC.  He was been evaluated by neurology in the emergency department.  Head CT negative for acute CVA.  He has received full strength aspirin.  EKG negative for acute ST elevations.  First troponin negative.  Chest xray pending.  Labs notable for K of 3.2 and hyperglycemia with random blood sugar of 269.   Assessment & Plan:   Principal Problem:   TIA (transient ischemic attack) Active Problems:   Chest pain   Hyperglycemia   Elevated blood pressure reading   Chronic edema   Muscle weakness of left upper  extremity   Left upper extremity weakness concerning for acute CVA vs TIA --Neurology consult appreciated --Telemetry monitoring --Continue full strength aspirin for now --MRI brain ASAP --complete echo --Bilateral carotid ultrasound --Fasting lipid panel and A1c --Hypercoag panel per neurology recommendation --PT/OT/Speech consults --RN stroke swallow screen completed in the ED.  Heart health/carb controlled diet.  Chest pain, with typical and atypical features. --Telemetry monitoring --Serial troponins negative x 3 --Lipid panel and A1c for risk factor stratification --Full strength aspirin for now --SL NTG prn for chest pain -- cardiology consulted- appreciate their recommendations -- need to initiate statin therapy given patient's history of CAD - will start atorvastatin  Hyperglycemia --Check A1c, highly suspicious that patient may be diabetic --POC glucose AC/HS, will cover blood sugars AC for now with sensitive scale  Chronic lower extremity edema --Continue lasix  Hypokalemia --Daily potassium supplement since he is on lasix  Elevated blood pressure without a history of HTN --Monitor trend to determine if daily therapy indicated.  Acid reflux --Protonix   DVT prophylaxis: SCDs Code Status: Full Code Family Communication: Patient's daughter and wife is bedside Disposition Plan: discharge tomorrow after stress test   Consultants:   Neurology  Procedures:   Echocardiogram  Carotid Ultrasound  Antimicrobials:   none    Subjective: Patient seen and evaluated. Was getting carotid ultrasound at time of exam.  Seen with neurology.  Patient still has  some residual weakness in the left arm distal to the elbow and in the left hand.  Still feeling a knot in his chest.  Last stress test was in the early 2000's.  Patient does not have a cardiologist outpatient.  Neurologist voices that it would behoove patient to get workup while in the hospital.     Objective: Vitals:   10/03/16 0500 10/03/16 0600 10/03/16 0837 10/03/16 1300  BP: (!) 100/50 (!) 111/55 130/77 125/73  Pulse: 63 67 80   Resp: 16 16 17 17   Temp: 97.9 F (36.6 C) 97.3 F (36.3 C) 97.7 F (36.5 C) 97.7 F (36.5 C)  TempSrc: Oral Oral Oral Oral  SpO2: 96% 96% 99%   Weight:      Height:       No intake or output data in the 24 hours ending 10/03/16 1339 Filed Weights   10/02/16 2202 10/02/16 2313  Weight: (!) 172.4 kg (380 lb) (!) 172.4 kg (380 lb)    Examination:  General exam: Appears calm and comfortable  Respiratory system: Clear to auscultation. Respiratory effort normal. Cardiovascular system: S1 & S2 heard, RRR. No JVD, murmurs, rubs, gallops or clicks. No pedal edema. Gastrointestinal system: Abdomen is obese, nondistended, soft and nontender. No organomegaly or masses felt. Normal bowel sounds heard. Central nervous system: Alert and oriented. No focal neurological deficits. Extremities: weakness in left upper extremity distal to elbow with flexion and extension, very weak hand grip Skin: No rashes, lesions or ulcers Psychiatry: Judgement and insight appear normal. Mood & affect appropriate.     Data Reviewed: I have personally reviewed following labs and imaging studies  CBC:  Recent Labs Lab 10/02/16 2204  WBC 9.4  NEUTROABS 6.0  HGB 15.4  HCT 43.9  MCV 87.3  PLT 209   Basic Metabolic Panel:  Recent Labs Lab 10/02/16 2204  NA 137  K 3.2*  CL 101  CO2 28  GLUCOSE 269*  BUN 15  CREATININE 1.09  CALCIUM 8.7*   GFR: Estimated Creatinine Clearance: 127 mL/min (by C-G formula based on SCr of 1.09 mg/dL). Liver Function Tests:  Recent Labs Lab 10/02/16 2204  AST 36  ALT 47  ALKPHOS 67  BILITOT 0.9  PROT 8.0  ALBUMIN 4.0   No results for input(s): LIPASE, AMYLASE in the last 168 hours. No results for input(s): AMMONIA in the last 168 hours. Coagulation Profile:  Recent Labs Lab 10/02/16 2204  INR 1.02   Cardiac  Enzymes:  Recent Labs Lab 10/02/16 2204 10/03/16 0155 10/03/16 0715  TROPONINI <0.03 <0.03 <0.03   BNP (last 3 results) No results for input(s): PROBNP in the last 8760 hours. HbA1C: No results for input(s): HGBA1C in the last 72 hours. CBG:  Recent Labs Lab 10/02/16 2206 10/03/16 0607 10/03/16 1243  GLUCAP 246* 232* 246*   Lipid Profile:  Recent Labs  10/03/16 0155  CHOL 166  HDL 25*  LDLCALC 98  TRIG 454*  CHOLHDL 6.6   Thyroid Function Tests: No results for input(s): TSH, T4TOTAL, FREET4, T3FREE, THYROIDAB in the last 72 hours. Anemia Panel: No results for input(s): VITAMINB12, FOLATE, FERRITIN, TIBC, IRON, RETICCTPCT in the last 72 hours. Sepsis Labs: No results for input(s): PROCALCITON, LATICACIDVEN in the last 168 hours.  No results found for this or any previous visit (from the past 240 hour(s)).       Radiology Studies: Ct Head Wo Contrast  Result Date: 10/02/2016 CLINICAL DATA:  Left-sided weakness and slurred speech for 2.3 hours. EXAM:  CT HEAD WITHOUT CONTRAST TECHNIQUE: Contiguous axial images were obtained from the base of the skull through the vertex without intravenous contrast. COMPARISON:  None. FINDINGS: Brain: No evidence of acute infarction, hemorrhage, hydrocephalus, extra-axial collection or mass lesion/mass effect. Vascular: No hyperdense vessel or unexpected calcification. Skull: Normal. Negative for fracture or focal lesion. Sinuses/Orbits: No acute finding. Other: None. IMPRESSION: No acute intracranial abnormality. Acute infarcts may be CT occult in the first 24 hours. These results were called by telephone at the time of interpretation on 10/02/2016 at 10:32 pm to Dr. Marily Memos , who verbally acknowledged these results. Electronically Signed   By: Tollie Eth M.D.   On: 10/02/2016 22:32        Scheduled Meds: . aspirin  300 mg Rectal Daily   Or  . aspirin  325 mg Oral Daily  . furosemide  20 mg Oral BID  . insulin aspart  0-9  Units Subcutaneous TID WC  . pantoprazole  40 mg Oral Daily  . potassium chloride  40 mEq Oral Daily   Continuous Infusions:    LOS: 0 days    Time spent: 35 minutes    Bennett Scrape, MD [facility] Pager [phone]  If 7PM-7AM, please contact night-coverage [email or web address] Password [email or web address] 10/03/2016, 1:39 PM

## 2016-10-03 NOTE — Progress Notes (Signed)
Unable to complete MRI of the brain as ordered due to patient's body habitus, he did not fit into the MRI scanner in order to perform the exam. Dr Lawson RadarUkleja was notified.

## 2016-10-03 NOTE — Progress Notes (Signed)
[name] is a 45 yo male [facility] with new onset weakness and numbness to Left forearm and hand. LKW 2200. NIHSS 1 CBG 246. Hx of HTN. To be admitted for further evaluation to rule out TIA

## 2016-10-03 NOTE — Evaluation (Signed)
Physical Therapy Evaluation Patient Details Name: Ernest RosenthalJerry Long MRN: [id number] DOB: 11/17/1971 Today's Date: 10/03/2016   History of Present Illness  45 y.o. gentleman with a history of chronic LE edema managed with lasix and acid reflux (he is protonix at home) who [facility] the ED in [facility] for acute onset left arm weakness with numbness. CT on 10/3 negative for acute infarct, MRI pending.   Clinical Impression  Pt is at or close to baseline functioning with exception to L UE and should be safe at home with wife/family. There are no further acute PT needs.  Will sign off at this time.     Follow Up Recommendations No PT follow up    Equipment Recommendations  None recommended by PT    Recommendations for Other Services       Precautions / Restrictions Precautions Precautions: None Restrictions Weight Bearing Restrictions: No      Mobility  Bed Mobility Overal bed mobility: Needs Assistance Bed Mobility: Supine to Sit;Sit to Supine     Supine to sit: Modified independent (Device/Increase time) Sit to supine: Modified independent (Device/Increase time)   General bed mobility comments: just a little clumbsy with L UE  Transfers Overall transfer level: Needs assistance Equipment used: None Transfers: Sit to/from Stand Sit to Stand: Supervision         General transfer comment: Supervision for safety. No unsteadiness or LOB noted.  Ambulation/Gait Ambulation/Gait assistance: Supervision Ambulation Distance (Feet): 400 Feet Assistive device: None Gait Pattern/deviations: WFL(Within Functional Limits)   Gait velocity interpretation: at or above normal speed for age/gender General Gait Details: steady, no deviation or LOB with challenge.  Stairs Stairs: Yes Stairs assistance: Independent Stair Management: No rails;Alternating pattern;Forwards Number of Stairs: 3    Wheelchair Mobility    Modified Rankin (Stroke Patients Only) Modified Rankin (Stroke  Patients Only) Pre-Morbid Rankin Score: No symptoms Modified Rankin: Slight disability     Balance Overall balance assessment: Independent Sitting-balance support: No upper extremity supported Sitting balance-Leahy Scale: Normal       Standing balance-Leahy Scale: Good                   Standardized Balance Assessment Standardized Balance Assessment : Dynamic Gait Index   Dynamic Gait Index Level Surface: Normal Change in Gait Speed: Normal Gait with Horizontal Head Turns: Normal Gait with Vertical Head Turns: Normal Gait and Pivot Turn: Normal Step Over Obstacle: Normal Step Around Obstacles: Normal Steps: Normal Total Score: 24       Pertinent Vitals/Pain Pain Assessment: Faces Faces Pain Scale: No hurt    Home Living Family/patient expects to be discharged to:: Private residence Living Arrangements: Spouse/significant other;Children Available Help at Discharge: Family;Available PRN/intermittently Type of Home: House Home Access: Stairs to enter Entrance Stairs-Rails: None Entrance Stairs-Number of Steps: 3 Home Layout: One level Home Equipment: Grab bars - tub/shower      Prior Function Level of Independence: Independent         Comments: works, drives, independent with ADL     Hand Dominance   Dominant Hand: Left    Extremity/Trunk Assessment               Lower Extremity Assessment: Overall WFL for tasks assessed         Communication   Communication: No difficulties  Cognition Arousal/Alertness: Awake/alert Behavior During Therapy: WFL for tasks assessed/performed Overall Cognitive Status: Within Functional Limits for tasks assessed  General Comments      Exercises     Assessment/Plan    PT Assessment Patent does not need any further PT services  PT Problem List            PT Treatment Interventions      PT Goals (Current goals can be found in the Care Plan section)  Acute Rehab  PT Goals Patient Stated Goal: return home by friday for homecoming football game PT Goal Formulation: All assessment and education complete, DC therapy    Frequency     Barriers to discharge        Co-evaluation               End of Session   Activity Tolerance: Patient tolerated treatment well Patient left: with family/visitor present (up in room, to the BR, wife present) Nurse Communication: Mobility status    Functional Assessment Tool Used: clinical judgement Functional Limitation: Mobility: Walking and moving around Mobility: Walking and Moving Around Current Status (Z6109): 0 percent impaired, limited or restricted Mobility: Walking and Moving Around Goal Status [phone]): 0 percent impaired, limited or restricted Mobility: Walking and Moving Around Discharge Status [phone]): 0 percent impaired, limited or restricted    Time: 9147-8295 PT Time Calculation (min) (ACUTE ONLY): 20 min   Charges:   PT Evaluation $PT Eval Low Complexity: 1 Procedure     PT G Codes:   PT G-Codes **NOT FOR INPATIENT CLASS** Functional Assessment Tool Used: clinical judgement Functional Limitation: Mobility: Walking and moving around Mobility: Walking and Moving Around Current Status (A2130): 0 percent impaired, limited or restricted Mobility: Walking and Moving Around Goal Status (Q6578): 0 percent impaired, limited or restricted Mobility: Walking and Moving Around Discharge Status (I6962): 0 percent impaired, limited or restricted    Cashtyn Pouliot, Eliseo Gum 10/03/2016, 5:16 PM  10/03/2016  [place] Bing, PT [phone]  (pager)

## 2016-10-03 NOTE — Progress Notes (Signed)
  Echocardiogram 2D Echocardiogram has been performed with definity.  Marisue Humblelexis N Dezerae Freiberger 10/03/2016, 2:55 PM

## 2016-10-03 NOTE — H&P (Signed)
History and Physical    Ernest Franco ZOX:[id number] DOB: May 12, 1971 DOA: 10/02/2016  PCP: Dr. Fredia Beets  Patient coming from: [facility]  Chief Complaint: Left upper extremity weakness and numbness, slurred speech, chest pain  HPI: Ernest Franco is a 45 y.o. gentleman with a history of chronic LE edema managed with lasix and acid reflux (he is protonix at home) who presented to the ED in [facility] for acute onset left arm weakness with numbness that started around 8:30PM.  Patient was sitting on the couch talking to his son when he first noticed the symptoms.  He did not take anything in an attempt to alleviate his symptoms.  He actually ate dinner before heading to the ED in [facility] for evaluation.  Around 9PM, while en route to the ED, his wife noticed slurred speech.  The patient was triaged in [facility] and transferred emergently to [facility] as a CODE STROKE.   The patient also reports a history of intermittent episodes of chest pain for the past several weeks.  The pain has occurred at rest and with exertion.  He describes it as a substernal heaviness that sometimes radiates into his neck or down his left arm and is associated with diaphoresis.  No significant nausea or vomiting.  No light-headness, dizziness, or syncope.  The pain typically lasts for several minutes before resolving on its own without specific intervention.  The patient was waiting to see his PCP at the end of the month for these symptoms.  He does not have active chest pain right now in the ED.  No history of falls, head trauma, or LOC.   ED Course: He has been evaluated by neurology.  Head CT negative for acute CVA.  He has received full strength aspirin.  EKG negative for acute ST elevations.  First troponin negative.  Chest xray pending.  Labs notable for K of 3.2 and hyperglycemia with random blood sugar of 269.  Hospitalist asked to admit.  Review of Systems: As per HPI otherwise 10 point review of systems  negative.    Past Medical History:  Diagnosis Date  . Hypertension   . Obesity   Elevated blood pressure in the ED, but patient denies prior history of HTN.  Past Surgical History:  Procedure Laterality Date  . CHOLECYSTECTOMY    . KNEE SURGERY    Right ACL repair   reports that he has never smoked. He has never used smokeless tobacco. He reports that he does not drink alcohol or use drugs.  Social EtOH (beer).  No illicit drug use.  He is married.  He has two biological children.  He also has step children.  No Known Allergies  FAMILY HISTORY: Mother has CAD, stents.  First MI at age 77.  Prior to Admission medications   Medication Sig Start Date End Date Taking? Authorizing Provider  furosemide (LASIX) 20 MG tablet Take 20 mg by mouth 2 (two) times daily.   Yes Historical Provider, MD  omeprazole (PRILOSEC) 20 MG capsule Take 40 mg by mouth every morning.   Yes Historical Provider, MD    Physical Exam: Vitals:   10/02/16 2309 10/02/16 2313 10/02/16 2330 10/03/16 0000  BP: 101/56  109/59 (!) 142/54  Pulse: 79  76 72  Resp: 11  21 12   Temp: 98 F (36.7 C)     TempSrc: Oral     SpO2: 100%  98% 97%  Weight:  (!) 172.4 kg (380 lb)  Height:  5\' 6"  (1.676 m)        Constitutional: NAD, calm, comfortable Vitals:   10/02/16 2309 10/02/16 2313 10/02/16 2330 10/03/16 0000  BP: 101/56  109/59 (!) 142/54  Pulse: 79  76 72  Resp: 11  21 12   Temp: 98 F (36.7 C)     TempSrc: Oral     SpO2: 100%  98% 97%  Weight:  (!) 172.4 kg (380 lb)    Height:  5\' 6"  (1.676 m)     Eyes: PERRL, lids and conjunctivae normal, no nystagmus ENMT: Mucous membranes are moist. Posterior pharynx clear of any exudate or lesions. Normal dentition.  Neck: normal appearance, supple Respiratory: clear to auscultation bilaterally, no wheezing, no crackles. Normal respiratory effort. No accessory muscle use.  Cardiovascular: Normal rate, regular rhythm, no murmurs / rubs / gallops. Trace  pretibial edema.  2+ pedal pulses. No carotid bruits.  GI: abdomen is obese/protuberant but compressible.  No tenderness.  No masses palpated.  Bowel sounds are present. Musculoskeletal:  No joint deformity in upper and lower extremities. No contractures. Normal muscle tone.  Decreased grip strength in his left hand.  Mild chest wall tenderness with palpation. Skin: warm and dry, small boil on left leg Neurologic: CN 2-12 grossly intact. Reduced sensation in LUE.  Weakness with decreased grip strength in his left hand.  No pronator drift.  Reduced fine motor movement in his left hand.  Psychiatric: Normal judgment and insight. Alert and oriented x 3. Normal mood.     Labs on Admission: I have personally reviewed following labs and imaging studies  CBC:  Recent Labs Lab 10/02/16 2204  WBC 9.4  NEUTROABS 6.0  HGB 15.4  HCT 43.9  MCV 87.3  PLT 209   Basic Metabolic Panel:  Recent Labs Lab 10/02/16 2204  NA 137  K 3.2*  CL 101  CO2 28  GLUCOSE 269*  BUN 15  CREATININE 1.09  CALCIUM 8.7*   GFR: Estimated Creatinine Clearance: 127 mL/min (by C-G formula based on SCr of 1.09 mg/dL). Liver Function Tests:  Recent Labs Lab 10/02/16 2204  AST 36  ALT 47  ALKPHOS 67  BILITOT 0.9  PROT 8.0  ALBUMIN 4.0   Coagulation Profile:  Recent Labs Lab 10/02/16 2204  INR 1.02   Cardiac Enzymes:  Recent Labs Lab 10/02/16 2204  TROPONINI <0.03   CBG:  Recent Labs Lab 10/02/16 2206  GLUCAP 246*     Radiological Exams on Admission: Ct Head Wo Contrast  Result Date: 10/02/2016 CLINICAL DATA:  Left-sided weakness and slurred speech for 2.3 hours. EXAM: CT HEAD WITHOUT CONTRAST TECHNIQUE: Contiguous axial images were obtained from the base of the skull through the vertex without intravenous contrast. COMPARISON:  None. FINDINGS: Brain: No evidence of acute infarction, hemorrhage, hydrocephalus, extra-axial collection or mass lesion/mass effect. Vascular: No hyperdense  vessel or unexpected calcification. Skull: Normal. Negative for fracture or focal lesion. Sinuses/Orbits: No acute finding. Other: None. IMPRESSION: No acute intracranial abnormality. Acute infarcts may be CT occult in the first 24 hours. These results were called by telephone at the time of interpretation on 10/02/2016 at 10:32 pm to Dr. Marily Memos , who verbally acknowledged these results. Electronically Signed   By: Tollie Eth M.D.   On: 10/02/2016 22:32    Chest x-ray pending  EKG: Independently reviewed. NSR.  No acute ST elevation.  Assessment/Plan Principal Problem:   TIA (transient ischemic attack) Active Problems:   Chest pain   Hyperglycemia  Elevated blood pressure reading   Chronic edema   Muscle weakness of left upper extremity      Left upper extremity weakness concerning for acute CVA vs TIA --Neurology consult appreciated --Telemetry monitoring --Continue full strength aspirin for now --MRI brain ASAP --complete echo --Bilateral carotid ultrasound --Fasting lipid panel and A1c --Hypercoag panel per neurology recommendation --PT/OT/Speech consults --RN stroke swallow screen completed in the ED.  Heart health/carb controlled diet.  Chest pain, with typical and atypical features. --Telemetry monitoring --Serial troponin --Lipid panel and A1c for risk factor stratification --Full strength aspirin for now --SL NTG prn for chest pain --Stress test has not been ordered yet; awaiting neuro results --Complete echo  Hyperglycemia --Check A1c, highly suspicious that patient may be diabetic --POC glucose AC/HS, will cover blood sugars AC for now with sensitive scale  Chronic lower extremity edema --Continue lasix  Hypokalemia --Daily potassium supplement since he is on lasix  Elevated blood pressure without a history of HTN --Monitor trend to determine if daily therapy indicated.  Acid reflux --Protonix   DVT prophylaxis: SCDs Code Status:  FULL Family Communication: Patient's wife, children at bedside at time of admission in the ED Disposition Plan: To be determined based on clinical findings Consults called: Neurology Admission status: Observation, telemetry   TIME SPENT: 70 minutes   Jerene Bearsarter,Tavie Haseman Harrison MD [facility] Pager [phone]  If 7PM-7AM, please contact night-coverage [email or web address] Password [email or web address]  10/03/2016, 12:47 AM

## 2016-10-03 NOTE — Evaluation (Signed)
Occupational Therapy Evaluation Patient Details Name: Ernest RosenthalJerry Franco MRN: [id number] DOB: 02/13/1971 Today's Date: 10/03/2016    History of Present Illness 45 y.o. gentleman with a history of chronic LE edema managed with lasix and acid reflux (he is protonix at home) who [facility] for acute onset left arm weakness with numbness. CT on 10/3 negative for acute infarct, MRI pending.    Clinical Impression   Pt reports he was independent with ADL PTA. Currently pt overall min assist for ADL and supervision for functional mobility. Pt presenting with decreased strength, coordination, and sensation in dominant LUE impacting his independence and safety with ADL and work activities. Pt planning to d/c home with intermittent supervision from family. Recommending Neuro Outpatient OT for follow up. Pt would benefit from continued skilled OT to address established goals.    Follow Up Recommendations  Outpatient OT;Supervision - Intermittent    Equipment Recommendations  None recommended by OT    Recommendations for Other Services       Precautions / Restrictions Precautions Precautions: None Restrictions Weight Bearing Restrictions: No      Mobility Bed Mobility Overal bed mobility: Needs Assistance Bed Mobility: Supine to Sit;Sit to Supine     Supine to sit: Supervision;HOB elevated Sit to supine: Supervision;HOB elevated   General bed mobility comments: Supervision for safety. HOB elevated without use of bed rail.  Transfers Overall transfer level: Needs assistance Equipment used: None Transfers: Sit to/from Stand Sit to Stand: Supervision         General transfer comment: Supervision for safety. No unsteadiness or LOB noted.    Balance Overall balance assessment: Needs assistance Sitting-balance support: Feet supported;No upper extremity supported Sitting balance-Leahy Scale: Good     Standing balance support: No upper extremity supported;During  functional activity Standing balance-Leahy Scale: Good                              ADL Overall ADL's : Needs assistance/impaired   Eating/Feeding Details (indicate cue type and reason): Pt reports wife assisted with feeding breakfast this AM. Grooming: Minimal assistance;Standing;Oral care Grooming Details (indicate cue type and reason): Assist for toothpaste on toothbrush Upper Body Bathing: Minimal assitance;Sitting   Lower Body Bathing: Minimal assistance;Sit to/from stand   Upper Body Dressing : Supervision/safety;Sitting   Lower Body Dressing: Minimal assistance;Sit to/from stand   Toilet Transfer: Supervision/safety;Ambulation;Regular Toilet   Toileting- Clothing Manipulation and Hygiene: Minimal assistance;Sit to/from stand       Functional mobility during ADLs: Supervision/safety General ADL Comments: Pt reports wife assisted with feeding this AM; encouraged functional use of L UE, massage for sensation, and self ROM for edem control of hand.     Vision Additional Comments: Appears WFL.   Perception     Praxis      Pertinent Vitals/Pain Pain Assessment: Faces Faces Pain Scale: Hurts little more Pain Location: L UE from elbow to hand Pain Descriptors / Indicators: Aching;Sore Pain Intervention(s): Monitored during session     Hand Dominance Left   Extremity/Trunk Assessment Upper Extremity Assessment Upper Extremity Assessment: LUE deficits/detail LUE Deficits / Details: grossly 4/5. decreased grip strength. Limited finger flex/ext. Increased edema in hand. Poor sensation from elbow to hand. LUE Sensation: decreased light touch LUE Coordination: decreased fine motor;decreased gross motor   Lower Extremity Assessment Lower Extremity Assessment: Defer to PT evaluation       Communication Communication Communication: No difficulties   Cognition  Arousal/Alertness: Awake/alert Behavior During Therapy: WFL for tasks  assessed/performed Overall Cognitive Status: Within Functional Limits for tasks assessed                     General Comments       Exercises       Shoulder Instructions      Home Living Family/patient expects to be discharged to:: Private residence Living Arrangements: Spouse/significant other;Children Available Help at Discharge: Family;Available PRN/intermittently Type of Home: House Home Access: Stairs to enter [employer] of Steps: 3 Entrance Stairs-Rails: None Home Layout: One level     Bathroom Shower/Tub: Tub/shower unit Shower/tub characteristics: Engineer, building services: Standard     Home Equipment: Grab bars - tub/shower          Prior Functioning/Environment Level of Independence: Independent        Comments: works, drives, independent with ADL        OT Problem List: Decreased strength;Decreased range of motion;Decreased coordination;Impaired sensation;Obesity;Impaired UE functional use;Pain;Increased edema   OT Treatment/Interventions: Self-care/ADL training;Therapeutic exercise;Neuromuscular education;Energy conservation;DME and/or AE instruction;Therapeutic activities;Patient/family education;Balance training    OT Goals(Current goals can be found in the care plan section) Acute Rehab OT Goals Patient Stated Goal: return home by friday for homecoming football game OT Goal Formulation: With patient/family Time For Goal Achievement: 10/17/16 Potential to Achieve Goals: Good ADL Goals Pt Will Perform Upper Body Bathing: standing;with supervision Pt Will Perform Lower Body Bathing: with supervision Pt Will Perform Tub/Shower Transfer: Tub transfer;with supervision;ambulating Pt/caregiver will Perform Home Exercise Program: Increased ROM;Increased strength;Left upper extremity;With theraputty;Independently;With written HEP provided (increase fine/gross motor coordination) Additional ADL Goal #1: Pt will independently verbalize 3  edema management strategies for LUE.  OT Frequency: Min 2X/week   Barriers to D/C:            Co-evaluation              End of Session Nurse Communication: Mobility status  Activity Tolerance: Patient tolerated treatment well Patient left: in bed;with call bell/phone within reach;with bed alarm set;with family/visitor present   Time: 1610-9604 OT Time Calculation (min): 24 min Charges:  OT General Charges $OT Visit: 1 Procedure OT Evaluation $OT Eval Moderate Complexity: 1 Procedure OT Treatments $Self Care/Home Management : 8-22 mins G-Codes: OT G-codes **NOT FOR INPATIENT CLASS** Functional Assessment Tool Used: Clinical judgement Functional Limitation: Self care Self Care Current Status (V4098): At least 1 percent but less than 20 percent impaired, limited or restricted Self Care Goal Status (J1914): At least 1 percent but less than 20 percent impaired, limited or restricted   Gaye Alken M.S., OTR/L Pager: [phone]  10/03/2016, 9:58 AM

## 2016-10-03 NOTE — Progress Notes (Addendum)
`STROKE TEAM PROGRESS NOTE   HISTORY OF PRESENT ILLNESS (per record) Ernest Franco is an 45 y.o. male with a history of hypertension and obesity presenting with new onset weakness and numbness of left forearm and hand of sudden onset. He was last known well at 10:00 PM tonight 10/02/2016 (LKW). He has no previous history of stroke nor TIA. He has not been on antiplatelet therapy. No facial droop has been described and speech is been unchanged. He said no symptoms involving left side of his face nor left lower extremity. CT scan of his head showed no acute intracranial abnormality. NIH stroke score was 1 with numbness involving left forearm and hand. There was no drift of his left upper extremity, but he appeared to have distal left upper extremity weakness. patient was not administered IV t-PA secondary to minimal deficits. He was admitted for further evaluation and treatment.   SUBJECTIVE (INTERVAL HISTORY) His wife and attending MD is at the bedside.  Overall he feels his condition is gradually improving. He has no hx of brain or neck problems. He did play football in high school  But denies radicular or chronic neck pain   OBJECTIVE Temp:  [97.3 F (36.3 C)-98 F (36.7 C)] 97.7 F (36.5 C) (10/04 0837) Pulse Rate:  [63-90] 80 (10/04 0837) Cardiac Rhythm: Normal sinus rhythm (10/04 0707) Resp:  [11-21] 17 (10/04 0837) BP: (100-150)/(50-86) 130/77 (10/04 0837) SpO2:  [95 %-100 %] 99 % (10/04 0837) Weight:  [172.4 kg (380 lb)] 172.4 kg (380 lb) (10/03 2313)  CBC:  Recent Labs Lab 10/02/16 2204  WBC 9.4  NEUTROABS 6.0  HGB 15.4  HCT 43.9  MCV 87.3  PLT 209    Basic Metabolic Panel:  Recent Labs Lab 10/02/16 2204  NA 137  K 3.2*  CL 101  CO2 28  GLUCOSE 269*  BUN 15  CREATININE 1.09  CALCIUM 8.7*    Lipid Panel:    Component Value Date/Time   CHOL 166 10/03/2016 0155   TRIG 216 (H) 10/03/2016 0155   HDL 25 (L) 10/03/2016 0155   CHOLHDL 6.6 10/03/2016 0155   VLDL 43  (H) 10/03/2016 0155   LDLCALC 98 10/03/2016 0155   HgbA1c: No results found for: HGBA1C Urine Drug Screen:    Component Value Date/Time   LABOPIA NONE DETECTED 10/03/2016 0209   COCAINSCRNUR NONE DETECTED 10/03/2016 0209   LABBENZ NONE DETECTED 10/03/2016 0209   AMPHETMU NONE DETECTED 10/03/2016 0209   THCU NONE DETECTED 10/03/2016 0209   LABBARB NONE DETECTED 10/03/2016 0209      IMAGING  Ct Head Wo Contrast  Result Date: 10/02/2016 CLINICAL DATA:  Left-sided weakness and slurred speech for 2.3 hours. EXAM: CT HEAD WITHOUT CONTRAST TECHNIQUE: Contiguous axial images were obtained from the base of the skull through the vertex without intravenous contrast. COMPARISON:  None. FINDINGS: Brain: No evidence of acute infarction, hemorrhage, hydrocephalus, extra-axial collection or mass lesion/mass effect. Vascular: No hyperdense vessel or unexpected calcification. Skull: Normal. Negative for fracture or focal lesion. Sinuses/Orbits: No acute finding. Other: None. IMPRESSION: No acute intracranial abnormality. Acute infarcts may be CT occult in the first 24 hours. These results were called by telephone at the time of interpretation on 10/02/2016 at 10:32 pm to Dr. Marily MemosJASON MESNER , who verbally acknowledged these results. Electronically Signed   By: Tollie Ethavid  Kwon M.D.   On: 10/02/2016 22:32   Carotid Doppler   There is 1-39% bilateral ICA stenosis. Vertebral artery flow is antegrade.    PHYSICAL  EXAM Obese middle aged male not in distress. . Afebrile. Head is nontraumatic. Neck is supple without bruit.    Cardiac exam no murmur or gallop. Lungs are clear to auscultation. Distal pulses are well felt. Neurological Exam ;  Awake  Alert oriented x 3. Normal speech and language.eye movements full without nystagmus.fundi were not visualized. Vision acuity and fields appear normal. Hearing is normal. Palatal movements are normal. Face symmetric. Tongue midline. Normal strength, tone, reflexes and  coordination. Normal sensation except subjective diminished touch and pin prick sensation from left forearm down/. Gait deferred.  ASSESSMENT/PLAN [name] is a 45 y.o. male with history of HTN and obesity presenting with new onset LUE weakness and numbness as well as chest tightness for a few weeks. He did not receive IV t-PA due to minimal deficits.   Possible Stroke/TIA  MRI  / MRA  Unable to do due to body habitus  Will do CTA head instead. ordered  Carotid Doppler  No significant stenosis   2D Echo  pending   LDL 98  HgbA1c pending  SCDs for VTE prophylaxis  Diet heart healthy/carb modified Room service appropriate? Yes; Fluid consistency: Thin  No antithrombotic prior to admission, now on aspirin 325 mg daily  Patient counseled to be compliant with his antithrombotic medications  Ongoing aggressive stroke risk factor management  Therapy recommendations:  OP OT, PT pending   Disposition:  Anticipate return home  Hypertension  Stable  Permissive hypertension (OK if < 220/120) but gradually normalize in 5-7 days  Long-term BP goal normotensive  Hyperlipidemia  Home meds:  No statin  LDL 98, goal < 70 for stroke patients  Added lipitor 10  Continue statin at discharge  Other Stroke Risk Factors  Morbid Obesity, Body mass index is 61.33 kg/m., recommend weight loss, diet and exercise as appropriate   Obstructive sleep apnea, on CPAP at home  Other Active Problems  Chest tightness, cardiac workup underway  Hospital day # 0  Rhoderick Moody [facility] See Amion for Pager information 10/03/2016 9:56 AM  I have personally examined this patient, reviewed notes, independently viewed imaging studies, participated in medical decision making and plan of care.ROS completed by me personally and pertinent positives fully documented  I have made any additions or clarifications directly to the above note. Agree with note above. Continue ongoing  stroke evaluation. Start aspirin. D/w patient and Dr Mackie Pai.Grater than 50 % time during this 25 minute visit was spent on counselling and coordination of care about his stroke and TIA. Delia Heady, MD Medical Director [facility] Pager: [phone] 10/03/2016 8:19 PM    To contact Stroke Continuity provider, please refer to [email or web address]. After hours, contact General Neurology

## 2016-10-03 NOTE — Progress Notes (Signed)
**  Preliminary report by tech**  Carotid artery duplex completed. Findings are consistent with a 1-39 percent stenosis involving the right internal carotid artery and the left internal carotid artery. The vertebral arteries demonstrate antegrade flow.  10/03/16 10:56 AM Olen CordialGreg Tkai Large RVT

## 2016-10-04 DIAGNOSIS — E876 Hypokalemia: Secondary | ICD-10-CM | POA: Diagnosis present

## 2016-10-04 DIAGNOSIS — K219 Gastro-esophageal reflux disease without esophagitis: Secondary | ICD-10-CM | POA: Diagnosis present

## 2016-10-04 DIAGNOSIS — G459 Transient cerebral ischemic attack, unspecified: Secondary | ICD-10-CM | POA: Diagnosis present

## 2016-10-04 DIAGNOSIS — R600 Localized edema: Secondary | ICD-10-CM | POA: Diagnosis present

## 2016-10-04 DIAGNOSIS — Z6841 Body Mass Index (BMI) 40.0 and over, adult: Secondary | ICD-10-CM | POA: Diagnosis not present

## 2016-10-04 DIAGNOSIS — Z8249 Family history of ischemic heart disease and other diseases of the circulatory system: Secondary | ICD-10-CM | POA: Diagnosis not present

## 2016-10-04 DIAGNOSIS — R739 Hyperglycemia, unspecified: Secondary | ICD-10-CM | POA: Diagnosis not present

## 2016-10-04 DIAGNOSIS — Z833 Family history of diabetes mellitus: Secondary | ICD-10-CM | POA: Diagnosis not present

## 2016-10-04 DIAGNOSIS — G43909 Migraine, unspecified, not intractable, without status migrainosus: Secondary | ICD-10-CM | POA: Diagnosis present

## 2016-10-04 DIAGNOSIS — E1165 Type 2 diabetes mellitus with hyperglycemia: Secondary | ICD-10-CM | POA: Diagnosis present

## 2016-10-04 DIAGNOSIS — I639 Cerebral infarction, unspecified: Secondary | ICD-10-CM | POA: Diagnosis present

## 2016-10-04 DIAGNOSIS — R079 Chest pain, unspecified: Secondary | ICD-10-CM | POA: Diagnosis not present

## 2016-10-04 DIAGNOSIS — I10 Essential (primary) hypertension: Secondary | ICD-10-CM | POA: Diagnosis present

## 2016-10-04 DIAGNOSIS — R609 Edema, unspecified: Secondary | ICD-10-CM | POA: Diagnosis not present

## 2016-10-04 DIAGNOSIS — G458 Other transient cerebral ischemic attacks and related syndromes: Secondary | ICD-10-CM | POA: Diagnosis not present

## 2016-10-04 DIAGNOSIS — M6281 Muscle weakness (generalized): Secondary | ICD-10-CM | POA: Diagnosis not present

## 2016-10-04 DIAGNOSIS — E781 Pure hyperglyceridemia: Secondary | ICD-10-CM | POA: Diagnosis present

## 2016-10-04 DIAGNOSIS — R4781 Slurred speech: Secondary | ICD-10-CM | POA: Diagnosis present

## 2016-10-04 DIAGNOSIS — R0789 Other chest pain: Secondary | ICD-10-CM

## 2016-10-04 DIAGNOSIS — E662 Morbid (severe) obesity with alveolar hypoventilation: Secondary | ICD-10-CM | POA: Diagnosis present

## 2016-10-04 DIAGNOSIS — R29701 NIHSS score 1: Secondary | ICD-10-CM | POA: Diagnosis present

## 2016-10-04 LAB — GLUCOSE, CAPILLARY
GLUCOSE-CAPILLARY: 177 mg/dL — AB (ref 65–99)
GLUCOSE-CAPILLARY: 243 mg/dL — AB (ref 65–99)
Glucose-Capillary: 226 mg/dL — ABNORMAL HIGH (ref 65–99)
Glucose-Capillary: 185 mg/dL — ABNORMAL HIGH (ref 65–99)

## 2016-10-04 LAB — HEMOGLOBIN A1C
HEMOGLOBIN A1C: 8.1 % — AB (ref 4.8–5.6)
MEAN PLASMA GLUCOSE: 186 mg/dL

## 2016-10-04 LAB — LUPUS ANTICOAGULANT PANEL
DRVVT: 48.4 s — ABNORMAL HIGH (ref 0.0–47.0)
PTT LA: 35.7 s (ref 0.0–51.9)

## 2016-10-04 LAB — CARDIOLIPIN ANTIBODIES, IGG, IGM, IGA: Anticardiolipin IgM: 13 MPL U/mL — ABNORMAL HIGH (ref 0–12)

## 2016-10-04 LAB — BETA-2-GLYCOPROTEIN I ABS, IGG/M/A
Beta-2-Glycoprotein I IgA: <9 GPI IgA units (ref 0–25)
Beta-2-Glycoprotein I IgM: <9 GPI IgM units (ref 0–32)

## 2016-10-04 LAB — PROTEIN C, TOTAL: Protein C, Total: 68 % (ref 60–150)

## 2016-10-04 LAB — PROTEIN S ACTIVITY: PROTEIN S ACTIVITY: 70 % (ref 63–140)

## 2016-10-04 LAB — DRVVT MIX: DRVVT MIX: 41.2 s (ref 0.0–47.0)

## 2016-10-04 LAB — PROTEIN C ACTIVITY: Protein C Activity: 103 % (ref 73–180)

## 2016-10-04 LAB — HOMOCYSTEINE: HOMOCYSTEINE-NORM: 8.6 umol/L (ref 0.0–15.0)

## 2016-10-04 LAB — PROTEIN S, TOTAL: PROTEIN S AG TOTAL: 86 % (ref 60–150)

## 2016-10-04 MED ORDER — SUMATRIPTAN SUCCINATE 100 MG PO TABS
100.0000 mg | ORAL_TABLET | Freq: Once | ORAL | Status: DC
  Filled 2016-10-04: qty 1

## 2016-10-04 MED ORDER — ISOSORBIDE MONONITRATE ER 30 MG PO TB24: 30.0000 mg | ORAL_TABLET | Freq: Every day | ORAL | 0 refills | Status: DC

## 2016-10-04 MED ORDER — ATORVASTATIN CALCIUM 10 MG PO TABS: 10.0000 mg | ORAL_TABLET | Freq: Every day | ORAL | 0 refills | Status: DC

## 2016-10-04 MED ORDER — ASPIRIN 325 MG PO TABS: 325.0000 mg | ORAL_TABLET | Freq: Every day | ORAL | Status: DC

## 2016-10-04 MED ORDER — KETOROLAC TROMETHAMINE 15 MG/ML IJ SOLN
15.0000 mg | Freq: Once | INTRAMUSCULAR | Status: AC
  Administered 2016-10-04: 15 mg via INTRAVENOUS
  Filled 2016-10-04: qty 1

## 2016-10-04 MED ORDER — METFORMIN HCL 500 MG PO TABS
500.0000 mg | ORAL_TABLET | Freq: Two times a day (BID) | ORAL | Status: DC
  Administered 2016-10-04 – 2016-10-05 (×2): 500 mg via ORAL
  Filled 2016-10-04 (×2): qty 1

## 2016-10-04 MED ORDER — HYDROMORPHONE HCL 1 MG/ML IJ SOLN
2.0000 mg | Freq: Once | INTRAMUSCULAR | Status: AC
  Administered 2016-10-04: 2 mg via INTRAVENOUS
  Filled 2016-10-04: qty 2

## 2016-10-04 MED ORDER — METFORMIN HCL 500 MG PO TABS: 500.0000 mg | ORAL_TABLET | Freq: Two times a day (BID) | ORAL | 0 refills | Status: DC

## 2016-10-04 NOTE — Care Management Note (Signed)
Case Management Note  Patient Details  Name: Ernest RosenthalJerry Franco MRN: [id number] Date of Birth: 11/08/1971  Subjective/Objective:                    Action/Plan: Pt discharging home with his wife. Recommendations are for outpatient therapy and MD in agreement. CM spoke to the patient and his wife and asked about the location of outpatient rehab. [place] their [employer]. CM had MD do a script for outpatient OT and provide the wife with information on [facility]. CM called [facility] and they accept [employer]. Bedside RN updated.   Expected Discharge Date:  10/04/16               Expected Discharge Plan:  Home/Self Care  In-House Referral:     Discharge planning Services  CM Consult  Post Acute Care Choice:    Choice offered to:     DME Arranged:    DME Agency:     HH Arranged:    HH Agency:     Status of Service:  Completed, signed off  If discussed at [employer] Length of Stay Meetings, dates discussed:    Additional Comments:  Kermit BaloKelli F Shabazz Mckey, RN 10/04/2016, 4:41 PM

## 2016-10-04 NOTE — Progress Notes (Signed)
OT Cancellation Note  Patient Details Name: Myles RosenthalJerry Sires MRN: [id number] DOB: 07/21/1971   Cancelled Treatment:    Reason Eval/Treat Not Completed: Pain limiting ability to participate (migraine headache; RN aware). Will follow up as time allows.  Gaye AlkenBailey A Idali Lafever M.S., OTR/L Pager: [phone]  10/04/2016, 12:22 PM

## 2016-10-04 NOTE — Progress Notes (Addendum)
`STROKE TEAM PROGRESS NOTE   HISTORY OF PRESENT ILLNESS (per record) Ernest Franco is an 45 y.o. male with a history of hypertension and obesity presenting with new onset weakness and numbness of left forearm and hand of sudden onset. He was last known well at 10:00 PM tonight 10/02/2016 ([facility]). He has no previous history of stroke nor TIA. He has not been on antiplatelet therapy. No facial droop has been described and speech is been unchanged. He said no symptoms involving left side of his face nor left lower extremity. CT scan of his head showed no acute intracranial abnormality. NIH stroke score was 1 with numbness involving left forearm and hand. There was no drift of his left upper extremity, but he appeared to have distal left upper extremity weakness. patient was not administered IV t-PA secondary to minimal deficits. He was admitted for further evaluation and treatment.   SUBJECTIVE (INTERVAL HISTORY) His wife and attending MD is at the bedside.  Overall he feels his condition is gradually improving. He is complaining of migraine today and just received medication for headache.c neck pain   OBJECTIVE Temp:  [97.5 F (36.4 C)-98.7 F (37.1 C)] 97.6 F (36.4 C) (10/05 1015) Pulse Rate:  [59-96] 61 (10/05 1015) Cardiac Rhythm: Normal sinus rhythm (10/05 0700) Resp:  [16-20] 16 (10/05 1015) BP: (111-135)/(54-67) 132/65 (10/05 1015) SpO2:  [97 %-99 %] 98 % (10/05 1015)  CBC:   Recent Labs Lab 10/02/16 2204  WBC 9.4  NEUTROABS 6.0  HGB 15.4  HCT 43.9  MCV 87.3  PLT 209    Basic Metabolic Panel:   Recent Labs Lab 10/02/16 2204  NA 137  K 3.2*  CL 101  CO2 28  GLUCOSE 269*  BUN 15  CREATININE 1.09  CALCIUM 8.7*    Lipid Panel:     Component Value Date/Time   CHOL 166 10/03/2016 0155   TRIG 216 (H) 10/03/2016 0155   HDL 25 (L) 10/03/2016 0155   CHOLHDL 6.6 10/03/2016 0155   VLDL 43 (H) 10/03/2016 0155   LDLCALC 98 10/03/2016 0155   HgbA1c:  Lab Results   Component Value Date   HGBA1C 8.1 (H) 10/03/2016   Urine Drug Screen:     Component Value Date/Time   LABOPIA NONE DETECTED 10/03/2016 0209   COCAINSCRNUR NONE DETECTED 10/03/2016 0209   LABBENZ NONE DETECTED 10/03/2016 0209   AMPHETMU NONE DETECTED 10/03/2016 0209   THCU NONE DETECTED 10/03/2016 0209   LABBARB NONE DETECTED 10/03/2016 0209      IMAGING  Ct Angio Head W Or Wo Contrast  Result Date: 10/03/2016 CLINICAL DATA:  LEFT upper extremity weakness beginning yesterday, slowly improving. History of hypertension. EXAM: CT ANGIOGRAPHY HEAD AND NECK TECHNIQUE: Multidetector CT imaging of the head and neck was performed using the standard protocol during bolus administration of intravenous contrast. Multiplanar CT image reconstructions and MIPs were obtained to evaluate the vascular anatomy. Carotid stenosis measurements (when applicable) are obtained utilizing NASCET criteria, using the distal internal carotid diameter as the denominator. CTA NECK performed at 1646 hours. Patient returned at 2153 hours for CTA head. CONTRAST:  100 cc Isovue 370 COMPARISON:  CT HEAD October 02, 2016 FINDINGS: Large body habitus results in overall noisy image quality. CTA NECK- delayed phase, greater venous than arterial contrast. AORTIC ARCH: Normal appearance of the thoracic arch, 2 vessel aortic arch. The origins of the innominate, left Common carotid artery and subclavian artery are patent. RIGHT CAROTID SYSTEM: Common carotid artery appears patent, coursing in  a straight line fashion though, no significant intra-arterial contrast. Trace calcific atherosclerosis of the carotid bulb, no definite stenosis. LEFT CAROTID SYSTEM: Common carotid artery appears patent, coursing in a straight line fashion though, no significant intra-arterial contrast. Trace calcific atherosclerosis of the carotid bulb, no definite stenosis. VERTEBRAL ARTERIES:Nondiagnostic as there is no significant arterial contrast opacification.  SKELETON: No acute osseous process though bone windows have not been submitted. OTHER NECK: Soft tissues of the neck are non-acute though, not tailored for evaluation. CTA HEAD- limited by venous contamination. ANTERIOR CIRCULATION: Patent cervical internal carotid arteries, petrous, cavernous and supra clinoid internal carotid arteries though limited assessment due to cavernous sinus venous opacification. Widely patent anterior communicating artery. Normal appearance of the anterior and middle cerebral arteries. Diffuse mild luminal regularity. No definite aneurysm or high-grade stenosis. POSTERIOR CIRCULATION: Patent vertebral arteries, vertebrobasilar junction and basilar artery, as well as main branch vessels. Normal appearance of the posterior cerebral arteries. Bilateral posterior communicating arteries present. Diffuse mild luminal irregularity. No definite aneurysm or high-grade stenosis. VENOUS SINUSES: Major dural venous sinuses are patent though not tailored for evaluation on this angiographic examination. ANATOMIC VARIANTS: None. DELAYED PHASE: No abnormal intracranial enhancement. IMPRESSION: CTA NECK: Habitus limited examination, further degraded by delayed phase. Nondiagnostic for assessment of vertebral arteries. Node definite hemodynamically significant stenosis. CTA HEAD: Habitus limited examination, further degraded by venous contamination. Diffuse mild cerebral artery luminal irregularity favoring artifact, less likely atherosclerosis or vasculopathy. No emergent large vessel occlusion or definite high-grade stenosis. Electronically Signed   By: Awilda Metro M.D.   On: 10/03/2016 23:03   Dg Chest 1 View  Result Date: 10/03/2016 CLINICAL DATA:  45 year old male with a history of chest pain EXAM: CHEST 1 VIEW COMPARISON:  None. FINDINGS: The heart size and mediastinal contours are within normal limits. Both lungs are clear. The visualized skeletal structures are unremarkable. IMPRESSION:  No radiographic evidence of acute cardiopulmonary disease. Signed, Yvone Neu. Loreta Ave, DO [facility] Electronically Signed   By: Gilmer Mor D.O.   On: 10/03/2016 15:23   Ct Head Wo Contrast  Result Date: 10/02/2016 CLINICAL DATA:  Left-sided weakness and slurred speech for 2.3 hours. EXAM: CT HEAD WITHOUT CONTRAST TECHNIQUE: Contiguous axial images were obtained from the base of the skull through the vertex without intravenous contrast. COMPARISON:  None. FINDINGS: Brain: No evidence of acute infarction, hemorrhage, hydrocephalus, extra-axial collection or mass lesion/mass effect. Vascular: No hyperdense vessel or unexpected calcification. Skull: Normal. Negative for fracture or focal lesion. Sinuses/Orbits: No acute finding. Other: None. IMPRESSION: No acute intracranial abnormality. Acute infarcts may be CT occult in the first 24 hours. These results were called by telephone at the time of interpretation on 10/02/2016 at 10:32 pm to Dr. Marily Memos , who verbally acknowledged these results. Electronically Signed   By: Tollie Eth M.D.   On: 10/02/2016 22:32   Ct Angio Neck W Or Wo Contrast  Result Date: 10/03/2016 CLINICAL DATA:  LEFT upper extremity weakness beginning yesterday, slowly improving. History of hypertension. EXAM: CT ANGIOGRAPHY HEAD AND NECK TECHNIQUE: Multidetector CT imaging of the head and neck was performed using the standard protocol during bolus administration of intravenous contrast. Multiplanar CT image reconstructions and MIPs were obtained to evaluate the vascular anatomy. Carotid stenosis measurements (when applicable) are obtained utilizing NASCET criteria, using the distal internal carotid diameter as the denominator. CTA NECK performed at 1646 hours. Patient returned at 2153 hours for CTA head. CONTRAST:  100 cc Isovue 370  COMPARISON:  CT HEAD October 02, 2016 FINDINGS: Large body habitus results in overall noisy image  quality. CTA NECK- delayed phase, greater venous than arterial contrast. AORTIC ARCH: Normal appearance of the thoracic arch, 2 vessel aortic arch. The origins of the innominate, left Common carotid artery and subclavian artery are patent. RIGHT CAROTID SYSTEM: Common carotid artery appears patent, coursing in a straight line fashion though, no significant intra-arterial contrast. Trace calcific atherosclerosis of the carotid bulb, no definite stenosis. LEFT CAROTID SYSTEM: Common carotid artery appears patent, coursing in a straight line fashion though, no significant intra-arterial contrast. Trace calcific atherosclerosis of the carotid bulb, no definite stenosis. VERTEBRAL ARTERIES:Nondiagnostic as there is no significant arterial contrast opacification. SKELETON: No acute osseous process though bone windows have not been submitted. OTHER NECK: Soft tissues of the neck are non-acute though, not tailored for evaluation. CTA HEAD- limited by venous contamination. ANTERIOR CIRCULATION: Patent cervical internal carotid arteries, petrous, cavernous and supra clinoid internal carotid arteries though limited assessment due to cavernous sinus venous opacification. Widely patent anterior communicating artery. Normal appearance of the anterior and middle cerebral arteries. Diffuse mild luminal regularity. No definite aneurysm or high-grade stenosis. POSTERIOR CIRCULATION: Patent vertebral arteries, vertebrobasilar junction and basilar artery, as well as main branch vessels. Normal appearance of the posterior cerebral arteries. Bilateral posterior communicating arteries present. Diffuse mild luminal irregularity. No definite aneurysm or high-grade stenosis. VENOUS SINUSES: Major dural venous sinuses are patent though not tailored for evaluation on this angiographic examination. ANATOMIC VARIANTS: None. DELAYED PHASE: No abnormal intracranial enhancement. IMPRESSION: CTA NECK: Habitus limited examination, further degraded by  delayed phase. Nondiagnostic for assessment of vertebral arteries. Node definite hemodynamically significant stenosis. CTA HEAD: Habitus limited examination, further degraded by venous contamination. Diffuse mild cerebral artery luminal irregularity favoring artifact, less likely atherosclerosis or vasculopathy. No emergent large vessel occlusion or definite high-grade stenosis. Electronically Signed   By: Awilda Metro M.D.   On: 10/03/2016 23:03   Carotid Doppler   There is 1-39% bilateral ICA stenosis. Vertebral artery flow is antegrade.    PHYSICAL EXAM Obese middle aged male not in distress. . Afebrile. Head is nontraumatic. Neck is supple without bruit.    Cardiac exam no murmur or gallop. Lungs are clear to auscultation. Distal pulses are well felt. Neurological Exam ;  Awake  Alert oriented x 3. Normal speech and language.eye movements full without nystagmus.fundi were not visualized. Vision acuity and fields appear normal. Hearing is normal. Palatal movements are normal. Face symmetric. Tongue midline. Normal strength, tone, reflexes and coordination. Normal sensation except subjective diminished touch and pin prick sensation from left forearm down/. Gait deferred.  ASSESSMENT/PLAN [name] is a 45 y.o. male with history of HTN and obesity presenting with new onset LUE weakness and numbness as well as chest tightness for a few weeks. He did not receive IV t-PA due to minimal deficits.   Possible Stroke/TIA like episode but likely migraine related.  MRI  / MRA  Unable to do due to large body habitus  CT angio head and neck limited by body habitus but no emergent large vessel occlusion   Carotid Doppler  No significant stenosis  2D Echo  Left ventricle: The cavity size was normal. Wall thickness was   normal. Systolic function was normal. The estimated ejection   fraction was in the range of 55% to 60%. Wall motion was normal;    there were no regional wall motion  abnormalitiesLDL 98  HgbA1c 8.1  SCDs for  VTE prophylaxis Diet heart healthy/carb modified Room service appropriate? Yes; Fluid consistency: Thin  No antithrombotic prior to admission, now on aspirin 325 mg daily  Patient counseled to be compliant with his antithrombotic medications  Ongoing aggressive stroke risk factor management  Therapy recommendations:  OP OT, PT pending   Disposition:  Anticipate return home  Hypertension  Stable  Permissive hypertension (OK if < 220/120) but gradually normalize in 5-7 days  Long-term BP goal normotensive  Hyperlipidemia  Home meds:  No statin  LDL 98, goal < 70 for stroke patients  Added lipitor 10  Continue statin at discharge  Other Stroke Risk Factors  Morbid Obesity, Body mass index is 61.33 kg/m., recommend weight loss, diet and exercise as appropriate   Obstructive sleep apnea, on CPAP at home  Other Active Problems  Chest tightness, cardiac workup underway  Hospital day # 0  Ernest Franco  [facility] See Amion for Pager information 10/04/2016 1:35 PM  I have personally examined this patient, reviewed notes, independently viewed imaging studies, participated in medical decision making and plan of care.ROS completed by me personally and pertinent positives fully documented  I have made any additions or clarifications directly to the above note. Agree with note above.  Continue aspirin. D/w patient and Dr Mackie PaiAlexandria..Greater than 50 % time during this 25 minute visit was spent on counselling and coordination of care about his stroke, migraine and TIA.Stroke team will sign off. Call for questions. Delia HeadyPramod Zyon Grout, MD Medical Director [facility] Pager: [phone] 10/04/2016 1:35 PM    To contact Stroke Continuity provider, please refer to [email or web address]. After hours, contact General Neurology

## 2016-10-04 NOTE — Progress Notes (Signed)
Set up patient's CPAP at bedside. Wife and patient both expressed understanding and state that they will place CPAP on themselves. RT will monitor as needed.

## 2016-10-04 NOTE — Progress Notes (Signed)
PROGRESS NOTE    Ernest Franco  WUJ:[id number] DOB: [birth year] DOA: 10/02/2016 PCP: Cheral Bay, MD   Brief Narrative:  Ernest Franco is a 45 y.o. gentleman with a history of chronic LE edema managed with lasix and acid reflux (he is protonix at home) who presented to the ED in [facility] for acute onset left arm weakness with numbness that started around 8:30PM.  Patient was sitting on the couch talking to his son when he first noticed the symptoms.  He did not take anything in an attempt to alleviate his symptoms.  He actually ate dinner before heading to the ED in [facility] for evaluation.  Around 9PM, while en route to the ED, his wife noticed slurred speech.  The patient was triaged in [facility] and transferred emergently to [facility] as a CODE STROKE.  The patient also reports a history of intermittent episodes of chest pain for the past several weeks.  The pain has occurred at rest and with exertion.  He describes it as a substernal heaviness that sometimes radiates into his neck or down his left arm and is associated with diaphoresis.  No significant nausea or vomiting.  No light-headness, dizziness, or syncope.  The pain typically lasts for several minutes before resolving on its own without specific intervention.  The patient was waiting to see his PCP at the end of the month for these symptoms.  He does not have active chest pain right now in the ED. No history of falls, head trauma, or LOC.  He was been evaluated by neurology in the emergency department.  Head CT negative for acute CVA.  He has received full strength aspirin.  EKG negative for acute ST elevations.  First troponin negative.  Chest xray pending.  Labs notable for K of 3.2 and hyperglycemia with random blood sugar of 269.   Assessment & Plan:   Principal Problem:   TIA (transient ischemic attack) Active Problems:   Chest pain   Hyperglycemia   Elevated blood pressure reading   Chronic edema   Muscle weakness of left upper  extremity   Left upper extremity weakness concerning for acute CVA vs TIA --Neurology consult and signed off as stroke unlikely --Telemetry monitoring -- Continue full strength aspirin for now -- MRI brain unable to be performed due to body habitus -- CTA showed no abnormality but was a limited study --Bilateral carotid ultrasound WNL - HgA1c elevated at 8.1 --PT/OT/Speech consults --RN stroke swallow screen completed in the ED.  Heart health/carb controlled diet.  Chest pain, with typical and atypical features. --Telemetry monitoring --Serial troponins negative x 3 --Lipid panel and A1c for risk factor stratification --Full strength aspirin for now --SL NTG prn for chest pain -- cardiology consulted- appreciate their recommendations -- need to initiate statin therapy given patient's history of CAD - will start atorvastatin - patient to be placed on low dose Imdur tomorrow AM  Hyperglycemia -- HgA1c at 8.1 --started on Metformin 500mg  PO BID --POC glucose AC/HS, will cover blood sugars AC for now with sensitive scale  Chronic lower extremity edema --Continue lasix  Hypokalemia --Daily potassium supplement since he is on lasix - repeat BMP in am  Elevated blood pressure without a history of HTN --Monitor trend to determine if daily therapy indicated.  Acid reflux --Protonix   DVT prophylaxis: SCDs Code Status: Full Code Family Communication: Patient's wife is bedside Disposition Plan: discharge in am if migraine improved   Consultants:   Neurology  Cardiology  Procedures:   Echocardiogram  Carotid Ultrasound  Antimicrobials:   none    Subjective: Patient reports he has a migraine and normally does not have a migraine at home. He reports that his vision is not changed but he awoke with a migraine this morning.  He mentions he does not feel nauseous but does feel sensitive to light.  Weakness is almost completely resolved in patients left hand  and wrist.  Original plan for discharge today held as patient migraine persisted throughout much of the day.    Objective: Vitals:   10/04/16 0134 10/04/16 0656 10/04/16 1015 10/04/16 1650  BP: (!) 129/54 111/62 132/65 (!) 126/53  Pulse: (!) 59 (!) 59 61 67  Resp: 18 18 16 20   Temp: 97.6 F (36.4 C) 97.5 F (36.4 C) 97.6 F (36.4 C) 98 F (36.7 C)  TempSrc: Oral Oral Oral Oral  SpO2: 99% 97% 98% 98%  Weight:      Height:       No intake or output data in the 24 hours ending 10/04/16 1918 Filed Weights   10/02/16 2202 10/02/16 2313  Weight: (!) 172.4 kg (380 lb) (!) 172.4 kg (380 lb)    Examination:  General exam: Appears calm and comfortable  Respiratory system: Clear to auscultation. Respiratory effort normal. Cardiovascular system: S1 & S2 heard, RRR. No JVD, murmurs, rubs, gallops or clicks. No pedal edema. Gastrointestinal system: Abdomen is obese, nondistended, soft and nontender. No organomegaly or masses felt. Normal bowel sounds heard. Central nervous system: Alert and oriented. No focal neurological deficits. Extremities: significantly improved grip strength in the left hand and 5/5 power at elbow with flexion and extension Skin: No rashes, lesions or ulcers Psychiatry: Judgement and insight appear normal. Mood & affect appropriate.     Data Reviewed: I have personally reviewed following labs and imaging studies  CBC:  Recent Labs Lab 10/02/16 2204  WBC 9.4  NEUTROABS 6.0  HGB 15.4  HCT 43.9  MCV 87.3  PLT 209   Basic Metabolic Panel:  Recent Labs Lab 10/02/16 2204  NA 137  K 3.2*  CL 101  CO2 28  GLUCOSE 269*  BUN 15  CREATININE 1.09  CALCIUM 8.7*   GFR: Estimated Creatinine Clearance: 127 mL/min (by C-G formula based on SCr of 1.09 mg/dL). Liver Function Tests:  Recent Labs Lab 10/02/16 2204  AST 36  ALT 47  ALKPHOS 67  BILITOT 0.9  PROT 8.0  ALBUMIN 4.0   No results for input(s): LIPASE, AMYLASE in the last 168 hours. No  results for input(s): AMMONIA in the last 168 hours. Coagulation Profile:  Recent Labs Lab 10/02/16 2204  INR 1.02   Cardiac Enzymes:  Recent Labs Lab 10/02/16 2204 10/03/16 0155 10/03/16 0715 10/03/16 1518  TROPONINI <0.03 <0.03 <0.03 <0.03   BNP (last 3 results) No results for input(s): PROBNP in the last 8760 hours. HbA1C:  Recent Labs  10/03/16 0155  HGBA1C 8.1*   CBG:  Recent Labs Lab 10/03/16 1651 10/03/16 2107 10/04/16 0652 10/04/16 1139 10/04/16 1616  GLUCAP 196* 252* 177* 226* 243*   Lipid Profile:  Recent Labs  10/03/16 0155  CHOL 166  HDL 25*  LDLCALC 98  TRIG 161*  CHOLHDL 6.6   Thyroid Function Tests: No results for input(s): TSH, T4TOTAL, FREET4, T3FREE, THYROIDAB in the last 72 hours. Anemia Panel: No results for input(s): VITAMINB12, FOLATE, FERRITIN, TIBC, IRON, RETICCTPCT in the last 72 hours. Sepsis Labs: No results for input(s): PROCALCITON, LATICACIDVEN in the  last 168 hours.  No results found for this or any previous visit (from the past 240 hour(s)).       Radiology Studies: Ct Angio Head W Or Wo Contrast  Result Date: 10/03/2016 CLINICAL DATA:  LEFT upper extremity weakness beginning yesterday, slowly improving. History of hypertension. EXAM: CT ANGIOGRAPHY HEAD AND NECK TECHNIQUE: Multidetector CT imaging of the head and neck was performed using the standard protocol during bolus administration of intravenous contrast. Multiplanar CT image reconstructions and MIPs were obtained to evaluate the vascular anatomy. Carotid stenosis measurements (when applicable) are obtained utilizing NASCET criteria, using the distal internal carotid diameter as the denominator. CTA NECK performed at 1646 hours. Patient returned at 2153 hours for CTA head. CONTRAST:  100 cc Isovue 370 COMPARISON:  CT HEAD October 02, 2016 FINDINGS: Large body habitus results in overall noisy image quality. CTA NECK- delayed phase, greater venous than arterial  contrast. AORTIC ARCH: Normal appearance of the thoracic arch, 2 vessel aortic arch. The origins of the innominate, left Common carotid artery and subclavian artery are patent. RIGHT CAROTID SYSTEM: Common carotid artery appears patent, coursing in a straight line fashion though, no significant intra-arterial contrast. Trace calcific atherosclerosis of the carotid bulb, no definite stenosis. LEFT CAROTID SYSTEM: Common carotid artery appears patent, coursing in a straight line fashion though, no significant intra-arterial contrast. Trace calcific atherosclerosis of the carotid bulb, no definite stenosis. VERTEBRAL ARTERIES:Nondiagnostic as there is no significant arterial contrast opacification. SKELETON: No acute osseous process though bone windows have not been submitted. OTHER NECK: Soft tissues of the neck are non-acute though, not tailored for evaluation. CTA HEAD- limited by venous contamination. ANTERIOR CIRCULATION: Patent cervical internal carotid arteries, petrous, cavernous and supra clinoid internal carotid arteries though limited assessment due to cavernous sinus venous opacification. Widely patent anterior communicating artery. Normal appearance of the anterior and middle cerebral arteries. Diffuse mild luminal regularity. No definite aneurysm or high-grade stenosis. POSTERIOR CIRCULATION: Patent vertebral arteries, vertebrobasilar junction and basilar artery, as well as main branch vessels. Normal appearance of the posterior cerebral arteries. Bilateral posterior communicating arteries present. Diffuse mild luminal irregularity. No definite aneurysm or high-grade stenosis. VENOUS SINUSES: Major dural venous sinuses are patent though not tailored for evaluation on this angiographic examination. ANATOMIC VARIANTS: None. DELAYED PHASE: No abnormal intracranial enhancement. IMPRESSION: CTA NECK: Habitus limited examination, further degraded by delayed phase. Nondiagnostic for assessment of vertebral  arteries. Node definite hemodynamically significant stenosis. CTA HEAD: Habitus limited examination, further degraded by venous contamination. Diffuse mild cerebral artery luminal irregularity favoring artifact, less likely atherosclerosis or vasculopathy. No emergent large vessel occlusion or definite high-grade stenosis. Electronically Signed   By: Awilda Metro M.D.   On: 10/03/2016 23:03   Dg Chest 1 View  Result Date: 10/03/2016 CLINICAL DATA:  45 year old male with a history of chest pain EXAM: CHEST 1 VIEW COMPARISON:  None. FINDINGS: The heart size and mediastinal contours are within normal limits. Both lungs are clear. The visualized skeletal structures are unremarkable. IMPRESSION: No radiographic evidence of acute cardiopulmonary disease. Signed, Yvone Neu. Loreta Ave, DO [facility] Radiology Electronically Signed   By: Gilmer Mor D.O.   On: 10/03/2016 15:23   Ct Head Wo Contrast  Result Date: 10/02/2016 CLINICAL DATA:  Left-sided weakness and slurred speech for 2.3 hours. EXAM: CT HEAD WITHOUT CONTRAST TECHNIQUE: Contiguous axial images were obtained from the base of the skull through the vertex without intravenous contrast. COMPARISON:  None. FINDINGS: Brain: No evidence of acute  infarction, hemorrhage, hydrocephalus, extra-axial collection or mass lesion/mass effect. Vascular: No hyperdense vessel or unexpected calcification. Skull: Normal. Negative for fracture or focal lesion. Sinuses/Orbits: No acute finding. Other: None. IMPRESSION: No acute intracranial abnormality. Acute infarcts may be CT occult in the first 24 hours. These results were called by telephone at the time of interpretation on 10/02/2016 at 10:32 pm to Dr. Marily MemosJASON MESNER , who verbally acknowledged these results. Electronically Signed   By: Tollie Ethavid  Kwon M.D.   On: 10/02/2016 22:32   Ct Angio Neck W Or Wo Contrast  Result Date: 10/03/2016 CLINICAL DATA:  LEFT upper extremity  weakness beginning yesterday, slowly improving. History of hypertension. EXAM: CT ANGIOGRAPHY HEAD AND NECK TECHNIQUE: Multidetector CT imaging of the head and neck was performed using the standard protocol during bolus administration of intravenous contrast. Multiplanar CT image reconstructions and MIPs were obtained to evaluate the vascular anatomy. Carotid stenosis measurements (when applicable) are obtained utilizing NASCET criteria, using the distal internal carotid diameter as the denominator. CTA NECK performed at 1646 hours. Patient returned at 2153 hours for CTA head. CONTRAST:  100 cc Isovue 370 COMPARISON:  CT HEAD October 02, 2016 FINDINGS: Large body habitus results in overall noisy image quality. CTA NECK- delayed phase, greater venous than arterial contrast. AORTIC ARCH: Normal appearance of the thoracic arch, 2 vessel aortic arch. The origins of the innominate, left Common carotid artery and subclavian artery are patent. RIGHT CAROTID SYSTEM: Common carotid artery appears patent, coursing in a straight line fashion though, no significant intra-arterial contrast. Trace calcific atherosclerosis of the carotid bulb, no definite stenosis. LEFT CAROTID SYSTEM: Common carotid artery appears patent, coursing in a straight line fashion though, no significant intra-arterial contrast. Trace calcific atherosclerosis of the carotid bulb, no definite stenosis. VERTEBRAL ARTERIES:Nondiagnostic as there is no significant arterial contrast opacification. SKELETON: No acute osseous process though bone windows have not been submitted. OTHER NECK: Soft tissues of the neck are non-acute though, not tailored for evaluation. CTA HEAD- limited by venous contamination. ANTERIOR CIRCULATION: Patent cervical internal carotid arteries, petrous, cavernous and supra clinoid internal carotid arteries though limited assessment due to cavernous sinus venous opacification. Widely patent anterior communicating artery. Normal  appearance of the anterior and middle cerebral arteries. Diffuse mild luminal regularity. No definite aneurysm or high-grade stenosis. POSTERIOR CIRCULATION: Patent vertebral arteries, vertebrobasilar junction and basilar artery, as well as main branch vessels. Normal appearance of the posterior cerebral arteries. Bilateral posterior communicating arteries present. Diffuse mild luminal irregularity. No definite aneurysm or high-grade stenosis. VENOUS SINUSES: Major dural venous sinuses are patent though not tailored for evaluation on this angiographic examination. ANATOMIC VARIANTS: None. DELAYED PHASE: No abnormal intracranial enhancement. IMPRESSION: CTA NECK: Habitus limited examination, further degraded by delayed phase. Nondiagnostic for assessment of vertebral arteries. Node definite hemodynamically significant stenosis. CTA HEAD: Habitus limited examination, further degraded by venous contamination. Diffuse mild cerebral artery luminal irregularity favoring artifact, less likely atherosclerosis or vasculopathy. No emergent large vessel occlusion or definite high-grade stenosis. Electronically Signed   By: Awilda Metroourtnay  Bloomer M.D.   On: 10/03/2016 23:03        Scheduled Meds: . aspirin  300 mg Rectal Daily   Or  . aspirin  325 mg Oral Daily  . atorvastatin  10 mg Oral q1800  . furosemide  20 mg Oral BID  . insulin aspart  0-9 Units Subcutaneous TID WC  . metFORMIN  500 mg Oral BID WC  . pantoprazole  40 mg Oral Daily  . potassium chloride  40 mEq Oral Daily  . SUMAtriptan  100 mg Oral Once   Continuous Infusions:    LOS: 0 days    Time spent: 35 minutes    Bennett Scrape, MD [facility] Pager [phone]  If 7PM-7AM, please contact night-coverage [email or web address] Password [email or web address] 10/04/2016, 7:18 PM

## 2016-10-04 NOTE — Progress Notes (Signed)
Inpatient Diabetes Program Recommendations  AACE/ADA: New Consensus Statement on Inpatient Glycemic Control (2015)  Target Ranges:  Prepandial:   less than 140 mg/dL      Peak postprandial:   less than 180 mg/dL (1-2 hours)      Critically ill patients:  140 - 180 mg/dL   Lab Results  Component Value Date   GLUCAP 177 (H) 10/04/2016   HGBA1C 8.1 (H) 10/03/2016    Review of Glycemic Control  Results for [name], Ernest Franco (MRN [id number]) as of 10/04/2016 08:47  Ref. Range 10/03/2016 06:07 10/03/2016 12:43 10/03/2016 16:51 10/03/2016 21:07 10/04/2016 06:52  Glucose-Capillary Latest Ref Range: 65 - 99 mg/dL 644232 (H) 034246 (H) 742196 (H) 252 (H) 177 (H)    Diabetes history: not diagnosed, A1C 8.1% Outpatient Diabetes medications: none Current orders for Inpatient glycemic control: Novolog sensitive correction 0-9 units tid  Inpatient Diabetes Program Recommendations:   Elevated A1C (6.5 or greater). Per ADA guidelines this meets the criteria of a diagnosis for diabetes. If appropriate, please consider placing this diagnosis in chart and inform patient of diagnosis.   Consider increasing Novolog correction to moderate correction scale 0-15 tid. (post prandial blood sugars remain elevated on current dose)  Susette RacerJulie Leslyn Monda, RN, [place], [place]HA, CDE Diabetes Coordinator Inpatient Diabetes Program  [phone] (Team Pager) [phone] [facility] Office) 10/04/2016 9:12 AM

## 2016-10-04 NOTE — Progress Notes (Signed)
Per Dr Noel Christmasharles Stewart do not give immitrex, give one time 2 mg dose IV dilaudid for h/a. RN will administer medication and continue to closely monitor patient.

## 2016-10-04 NOTE — Discharge Summary (Deleted)
Physician Discharge Summary  Ion Gonnella UJW:[id number] DOB: June 15, 1971 DOA: 10/02/2016  PCP: Cheral Bay, MD  Admit date: 10/02/2016 Discharge date: 10/04/2016  Admitted From: Home Disposition:  Home  Recommendations for Outpatient Follow-up:  1. Follow up with PCP in 1-2 weeks 2. Start new medications as prescribed 3. Please think about diet and exercise recommendations 4. Please set up outpatient OT  Home Health: No but patient will get outpatient OT  Equipment/Devices: none  Discharge Condition:Stable CODE STATUS: Full Diet recommendation: Heart Healthy / Carb Modified   Brief/Interim Summary: Jakim Drapeau a 45 y.o.gentleman with a history of chronic LE edema managed with lasix and acid reflux (he is protonix at home) who presented to the ED in [facility] for acute onset left arm weakness with numbness that started around 8:30PM. Patient was sitting on the couch talking to his son when he first noticed the symptoms. He did not take anything in an attempt to alleviate his symptoms. He actually ate dinner before heading to the ED in [facility] for evaluation. Around 9PM, while en route to the ED, his wife noticed slurred speech. The patient was triaged in [facility] and transferred emergently to [facility] as a CODE STROKE.  The patient also reports a history of intermittent episodes of chest pain for the past several weeks. The pain has occurred at rest and with exertion. He describes it as a substernal heaviness that sometimes radiates into his neck or down his left arm and is associated with diaphoresis. No significant nausea or vomiting. No light-headness, dizziness, or syncope. The pain typically lasts for several minutes before resolving on its own without specific intervention. The patient was waiting to see his PCP at the end of the month for these symptoms. He does not have active chest pain right now in the ED. No history of falls, head trauma, or LOC.  He was been  evaluated by neurology in the emergency department. Head CT negative for acute CVA. He has received full strength aspirin. EKG negative for acute ST elevations. First troponin negative. Chest xray pending. Labs notable for K of 3.2 and hyperglycemia with random blood sugar of 269.  Cardiology consulted and state patient can start a low dose Imdur and follow up outpatient if pain persists to discuss cath.   Discharge Diagnoses:  Principal Problem:   TIA (transient ischemic attack) Active Problems:   Chest pain   Hyperglycemia   Elevated blood pressure reading   Chronic edema   Muscle weakness of left upper extremity    Discharge Instructions  Discharge Instructions    Call MD for:  difficulty breathing, headache or visual disturbances    Complete by:  As directed    Call MD for:  extreme fatigue    Complete by:  As directed    Call MD for:  persistant dizziness or light-headedness    Complete by:  As directed    Call MD for:  persistant nausea and vomiting    Complete by:  As directed    Call MD for:  severe uncontrolled pain    Complete by:  As directed    Diet - low sodium heart healthy    Complete by:  As directed    Increase activity slowly    Complete by:  As directed        Medication List    TAKE these medications   aspirin 325 MG tablet Take 1 tablet (325 mg total) by mouth daily. Start taking on:  10/05/2016  atorvastatin 10 MG tablet Commonly known as:  LIPITOR Take 1 tablet (10 mg total) by mouth daily at 6 PM.   furosemide 20 MG tablet Commonly known as:  LASIX Take 20 mg by mouth 2 (two) times daily.   metFORMIN 500 MG tablet Commonly known as:  GLUCOPHAGE Take 1 tablet (500 mg total) by mouth 2 (two) times daily with a meal.   omeprazole 20 MG capsule Commonly known as:  PRILOSEC Take 40 mg by mouth every morning.       No Known Allergies  Consultations:  Cardiology  Neurology   Procedures/Studies: Ct Angio Head W Or Wo  Contrast  Result Date: 10/03/2016 CLINICAL DATA:  LEFT upper extremity weakness beginning yesterday, slowly improving. History of hypertension. EXAM: CT ANGIOGRAPHY HEAD AND NECK TECHNIQUE: Multidetector CT imaging of the head and neck was performed using the standard protocol during bolus administration of intravenous contrast. Multiplanar CT image reconstructions and MIPs were obtained to evaluate the vascular anatomy. Carotid stenosis measurements (when applicable) are obtained utilizing NASCET criteria, using the distal internal carotid diameter as the denominator. CTA NECK performed at 1646 hours. Patient returned at 2153 hours for CTA head. CONTRAST:  100 cc Isovue 370 COMPARISON:  CT HEAD October 02, 2016 FINDINGS: Large body habitus results in overall noisy image quality. CTA NECK- delayed phase, greater venous than arterial contrast. AORTIC ARCH: Normal appearance of the thoracic arch, 2 vessel aortic arch. The origins of the innominate, left Common carotid artery and subclavian artery are patent. RIGHT CAROTID SYSTEM: Common carotid artery appears patent, coursing in a straight line fashion though, no significant intra-arterial contrast. Trace calcific atherosclerosis of the carotid bulb, no definite stenosis. LEFT CAROTID SYSTEM: Common carotid artery appears patent, coursing in a straight line fashion though, no significant intra-arterial contrast. Trace calcific atherosclerosis of the carotid bulb, no definite stenosis. VERTEBRAL ARTERIES:Nondiagnostic as there is no significant arterial contrast opacification. SKELETON: No acute osseous process though bone windows have not been submitted. OTHER NECK: Soft tissues of the neck are non-acute though, not tailored for evaluation. CTA HEAD- limited by venous contamination. ANTERIOR CIRCULATION: Patent cervical internal carotid arteries, petrous, cavernous and supra clinoid internal carotid arteries though limited assessment due to cavernous sinus venous  opacification. Widely patent anterior communicating artery. Normal appearance of the anterior and middle cerebral arteries. Diffuse mild luminal regularity. No definite aneurysm or high-grade stenosis. POSTERIOR CIRCULATION: Patent vertebral arteries, vertebrobasilar junction and basilar artery, as well as main branch vessels. Normal appearance of the posterior cerebral arteries. Bilateral posterior communicating arteries present. Diffuse mild luminal irregularity. No definite aneurysm or high-grade stenosis. VENOUS SINUSES: Major dural venous sinuses are patent though not tailored for evaluation on this angiographic examination. ANATOMIC VARIANTS: None. DELAYED PHASE: No abnormal intracranial enhancement. IMPRESSION: CTA NECK: Habitus limited examination, further degraded by delayed phase. Nondiagnostic for assessment of vertebral arteries. Node definite hemodynamically significant stenosis. CTA HEAD: Habitus limited examination, further degraded by venous contamination. Diffuse mild cerebral artery luminal irregularity favoring artifact, less likely atherosclerosis or vasculopathy. No emergent large vessel occlusion or definite high-grade stenosis. Electronically Signed   By: Awilda Metro M.D.   On: 10/03/2016 23:03   Dg Chest 1 View  Result Date: 10/03/2016 CLINICAL DATA:  45 year old male with a history of chest pain EXAM: CHEST 1 VIEW COMPARISON:  None. FINDINGS: The heart size and mediastinal contours are within normal limits. Both lungs are clear. The visualized skeletal structures are unremarkable. IMPRESSION: No radiographic evidence of acute cardiopulmonary disease. Signed, Marijean Niemann  Kenna Gilbert, DO [facility] Radiology Electronically Signed   By: Gilmer Mor D.O.   On: 10/03/2016 15:23   Ct Head Wo Contrast  Result Date: 10/02/2016 CLINICAL DATA:  Left-sided weakness and slurred speech for 2.3 hours. EXAM: CT HEAD WITHOUT CONTRAST TECHNIQUE:  Contiguous axial images were obtained from the base of the skull through the vertex without intravenous contrast. COMPARISON:  None. FINDINGS: Brain: No evidence of acute infarction, hemorrhage, hydrocephalus, extra-axial collection or mass lesion/mass effect. Vascular: No hyperdense vessel or unexpected calcification. Skull: Normal. Negative for fracture or focal lesion. Sinuses/Orbits: No acute finding. Other: None. IMPRESSION: No acute intracranial abnormality. Acute infarcts may be CT occult in the first 24 hours. These results were called by telephone at the time of interpretation on 10/02/2016 at 10:32 pm to Dr. Marily Memos , who verbally acknowledged these results. Electronically Signed   By: Tollie Eth M.D.   On: 10/02/2016 22:32   Ct Angio Neck W Or Wo Contrast  Result Date: 10/03/2016 CLINICAL DATA:  LEFT upper extremity weakness beginning yesterday, slowly improving. History of hypertension. EXAM: CT ANGIOGRAPHY HEAD AND NECK TECHNIQUE: Multidetector CT imaging of the head and neck was performed using the standard protocol during bolus administration of intravenous contrast. Multiplanar CT image reconstructions and MIPs were obtained to evaluate the vascular anatomy. Carotid stenosis measurements (when applicable) are obtained utilizing NASCET criteria, using the distal internal carotid diameter as the denominator. CTA NECK performed at 1646 hours. Patient returned at 2153 hours for CTA head. CONTRAST:  100 cc Isovue 370 COMPARISON:  CT HEAD October 02, 2016 FINDINGS: Large body habitus results in overall noisy image quality. CTA NECK- delayed phase, greater venous than arterial contrast. AORTIC ARCH: Normal appearance of the thoracic arch, 2 vessel aortic arch. The origins of the innominate, left Common carotid artery and subclavian artery are patent. RIGHT CAROTID SYSTEM: Common carotid artery appears patent, coursing in a straight line fashion though, no significant intra-arterial contrast. Trace  calcific atherosclerosis of the carotid bulb, no definite stenosis. LEFT CAROTID SYSTEM: Common carotid artery appears patent, coursing in a straight line fashion though, no significant intra-arterial contrast. Trace calcific atherosclerosis of the carotid bulb, no definite stenosis. VERTEBRAL ARTERIES:Nondiagnostic as there is no significant arterial contrast opacification. SKELETON: No acute osseous process though bone windows have not been submitted. OTHER NECK: Soft tissues of the neck are non-acute though, not tailored for evaluation. CTA HEAD- limited by venous contamination. ANTERIOR CIRCULATION: Patent cervical internal carotid arteries, petrous, cavernous and supra clinoid internal carotid arteries though limited assessment due to cavernous sinus venous opacification. Widely patent anterior communicating artery. Normal appearance of the anterior and middle cerebral arteries. Diffuse mild luminal regularity. No definite aneurysm or high-grade stenosis. POSTERIOR CIRCULATION: Patent vertebral arteries, vertebrobasilar junction and basilar artery, as well as main branch vessels. Normal appearance of the posterior cerebral arteries. Bilateral posterior communicating arteries present. Diffuse mild luminal irregularity. No definite aneurysm or high-grade stenosis. VENOUS SINUSES: Major dural venous sinuses are patent though not tailored for evaluation on this angiographic examination. ANATOMIC VARIANTS: None. DELAYED PHASE: No abnormal intracranial enhancement. IMPRESSION: CTA NECK: Habitus limited examination, further degraded by delayed phase. Nondiagnostic for assessment of vertebral arteries. Node definite hemodynamically significant stenosis. CTA HEAD: Habitus limited examination, further degraded by venous contamination. Diffuse mild cerebral artery luminal irregularity favoring artifact, less likely atherosclerosis or vasculopathy. No emergent large vessel occlusion or definite high-grade stenosis.  Electronically Signed   By: Michel Santee.D.  On: 10/03/2016 23:03       Subjective: Patient voices he has a migraine and mentions he does not normally have migraine.  Improvement in weakness of left arm.  No concerns voiced by the stroke team.  Patient HgA1c found to be elevated- started on metformin.  Encouraged patient to make lifestyle changes.  Discharge Exam: Vitals:   10/04/16 0656 10/04/16 1015  BP: 111/62 132/65  Pulse: (!) 59 61  Resp: 18 16  Temp: 97.5 F (36.4 C) 97.6 F (36.4 C)   Vitals:   10/03/16 2110 10/04/16 0134 10/04/16 0656 10/04/16 1015  BP: 131/67 (!) 129/54 111/62 132/65  Pulse: 74 (!) 59 (!) 59 61  Resp: 20 18 18 16   Temp: 98.7 F (37.1 C) 97.6 F (36.4 C) 97.5 F (36.4 C) 97.6 F (36.4 C)  TempSrc: Axillary Oral Oral Oral  SpO2: 99% 99% 97% 98%  Weight:      Height:        General: Pt is alert, awake, not in acute distress Cardiovascular: RRR, S1/S2 +, no rubs, no gallops Respiratory: CTA bilaterally, no wheezing, no rhonchi Abdominal: Soft, NT, ND, bowel sounds + Extremities: no edema, no cyanosis    The results of significant diagnostics from this hospitalization (including imaging, microbiology, ancillary and laboratory) are listed below for reference.     Microbiology: No results found for this or any previous visit (from the past 240 hour(s)).   Labs: BNP (last 3 results) No results for input(s): BNP in the last 8760 hours. Basic Metabolic Panel:  Recent Labs Lab 10/02/16 2204  NA 137  K 3.2*  CL 101  CO2 28  GLUCOSE 269*  BUN 15  CREATININE 1.09  CALCIUM 8.7*   Liver Function Tests:  Recent Labs Lab 10/02/16 2204  AST 36  ALT 47  ALKPHOS 67  BILITOT 0.9  PROT 8.0  ALBUMIN 4.0   No results for input(s): LIPASE, AMYLASE in the last 168 hours. No results for input(s): AMMONIA in the last 168 hours. CBC:  Recent Labs Lab 10/02/16 2204  WBC 9.4  NEUTROABS 6.0  HGB 15.4  HCT 43.9  MCV 87.3  PLT  209   Cardiac Enzymes:  Recent Labs Lab 10/02/16 2204 10/03/16 0155 10/03/16 0715 10/03/16 1518  TROPONINI <0.03 <0.03 <0.03 <0.03   BNP: Invalid input(s): POCBNP CBG:  Recent Labs Lab 10/03/16 1651 10/03/16 2107 10/04/16 0652 10/04/16 1139 10/04/16 1616  GLUCAP 196* 252* 177* 226* 243*   D-Dimer No results for input(s): DDIMER in the last 72 hours. Hgb A1c  Recent Labs  10/03/16 0155  HGBA1C 8.1*   Lipid Profile  Recent Labs  10/03/16 0155  CHOL 166  HDL 25*  LDLCALC 98  TRIG 409216*  CHOLHDL 6.6   Thyroid function studies No results for input(s): TSH, T4TOTAL, T3FREE, THYROIDAB in the last 72 hours.  Invalid input(s): FREET3 Anemia work up No results for input(s): VITAMINB12, FOLATE, FERRITIN, TIBC, IRON, RETICCTPCT in the last 72 hours. Urinalysis No results found for: COLORURINE, APPEARANCEUR, LABSPEC, PHURINE, GLUCOSEU, HGBUR, BILIRUBINUR, KETONESUR, PROTEINUR, UROBILINOGEN, NITRITE, LEUKOCYTESUR Sepsis Labs Invalid input(s): PROCALCITONIN,  WBC,  LACTICIDVEN Microbiology No results found for this or any previous visit (from the past 240 hour(s)).   Time coordinating discharge: Over 30 minutes  SIGNED:   Bennett ScrapeAlex Hansel Devan, MD  [facility] 10/04/2016, 4:36 PM Pager [phone] If 7PM-7AM, please contact night-coverage [email or web address] Password [email or web address]

## 2016-10-04 NOTE — Progress Notes (Signed)
Patient reports sudden sharp, stabbing pain in left temple, double vision and increased generalized weakness/heaviness at 1600. Patient generalized weakness still present, constant pounding h/a 8/10, all other symptoms resolved at 1700. RN will continue to monitor and notify MD.

## 2016-10-05 DIAGNOSIS — E119 Type 2 diabetes mellitus without complications: Secondary | ICD-10-CM

## 2016-10-05 LAB — BASIC METABOLIC PANEL
Anion gap: 7 (ref 5–15)
BUN: 13 mg/dL (ref 6–20)
CALCIUM: 8.8 mg/dL — AB (ref 8.9–10.3)
CO2: 27 mmol/L (ref 22–32)
Chloride: 103 mmol/L (ref 101–111)
Creatinine, Ser: 1.06 mg/dL (ref 0.61–1.24)
GFR calc non Af Amer: >60 mL/min (ref >60)
Glucose, Bld: 187 mg/dL — ABNORMAL HIGH (ref 65–99)
Potassium: 4.2 mmol/L (ref 3.5–5.1)
SODIUM: 137 mmol/L (ref 135–145)

## 2016-10-05 LAB — GLUCOSE, CAPILLARY: Glucose-Capillary: 178 mg/dL — ABNORMAL HIGH (ref 65–99)

## 2016-10-05 MED ORDER — SUMATRIPTAN SUCCINATE 100 MG PO TABS: 100.0000 mg | ORAL_TABLET | Freq: Once | ORAL | 0 refills | Status: DC | PRN

## 2016-10-05 MED ORDER — DIPHENHYDRAMINE HCL 50 MG/ML IJ SOLN
12.5000 mg | Freq: Once | INTRAMUSCULAR | Status: AC
Start: 2016-10-05 — End: 2016-10-05
  Administered 2016-10-05: 12.5 mg via INTRAVENOUS
  Filled 2016-10-05: qty 1

## 2016-10-05 MED ORDER — KETOROLAC TROMETHAMINE 30 MG/ML IJ SOLN
30.0000 mg | Freq: Once | INTRAMUSCULAR | Status: AC
  Administered 2016-10-05: 30 mg via INTRAVENOUS
  Filled 2016-10-05: qty 1

## 2016-10-05 MED ORDER — METOCLOPRAMIDE HCL 5 MG/ML IJ SOLN
10.0000 mg | Freq: Once | INTRAMUSCULAR | Status: AC
  Administered 2016-10-05: 10 mg via INTRAVENOUS
  Filled 2016-10-05: qty 2

## 2016-10-05 NOTE — Progress Notes (Signed)
Patient discharged home with spouse. IV and telemetry discontinued. Work notes given to patient and spouse. Discharge information given. Patient questions asked and answered. Pt will be transported from unit via wheelchair. [name]

## 2016-10-05 NOTE — Progress Notes (Signed)
Inpatient Diabetes Program Recommendations  AACE/ADA: New Consensus Statement on Inpatient Glycemic Control (2015)  Target Ranges:  Prepandial:   less than 140 mg/dL      Peak postprandial:   less than 180 mg/dL (1-2 hours)      Critically ill patients:  140 - 180 mg/dL   Lab Results  Component Value Date   GLUCAP 178 (H) 10/05/2016   HGBA1C 8.1 (H) 10/03/2016    Diabetes history: not diagnosed, A1C 8.1% Outpatient Diabetes medications: none Current orders for Inpatient glycemic control: Novolog sensitive correction 0-9 units tid, Metformin 500mg  bid  Inpatient Diabetes Program Recommendations:    In an incomplete note written by Sandrea HammondStephen Dignan on 10/02/16, the MD states the patient told him he has a previous diagnosis of diabetes.   Consider increasing Novolog correction to moderate correction scale 0-15 tid. (post prandial blood sugars remain elevated on current dose)  Susette RacerJulie Shene Maxfield, RN, [place], [place]HA, CDE Diabetes Coordinator Inpatient Diabetes Program  [phone] (Team Pager) [phone] [facility] Office) 10/05/2016 7:28 AM

## 2016-10-05 NOTE — Progress Notes (Signed)
SLP Cancellation Note  Patient Details Name: Ernest Franco MRN: [id number] DOB: 11/26/1971   Cancelled treatment:       Reason Eval/Treat Not Completed: SLP screened, no needs identified, will sign off   Tayte Mcwherter,PAT, M.S., CCC-SLP 10/05/2016, 9:56 AM

## 2016-10-05 NOTE — Progress Notes (Signed)
Occupational Therapy Treatment and Discharge Patient Details Name: Ernest Franco MRN: [id number] DOB: May 25, 1971 Today's Date: 10/05/2016    History of present illness 45 y.o. gentleman with a history of chronic LE edema managed with lasix and acid reflux (he is protonix at home) who presented to the ED in [facility] for acute onset left arm weakness with numbness. CT on 10/3 negative for acute infarct, MRI pending.    OT comments  Pt reports LUE symptoms have almost completely resolved; strength, coordination, and AROM WFL. Pt reports mild sensation deficits remain. Educated pt on safety with UE sensation changes. Pt able to perform fine motor activity without difficulty; encouraged continued functional use of LUE. Educated pt and wife on signs and symptom of stroke; they verbalized understanding. Updated d/c plan to no follow up OT. No further acute OT needs identified; signing off at this time. Please re-consult if needs change.    Follow Up Recommendations  No OT follow up;Supervision - Intermittent    Equipment Recommendations  None recommended by OT    Recommendations for Other Services      Precautions / Restrictions Precautions Precautions: None       Mobility Bed Mobility                  Transfers                      Balance                                   ADL Overall ADL's : Modified independent                                       General ADL Comments: Pt reports L hand deficits have almost completely resolved at this time. LUE strength overall WFL at this time, AROM WFL. Pt reports he continues to have mild sensation changes. Educated on safety with sensory deficits in dominant hand. Pt able to perform writing activity and reports he has been self feeding. Encounaged continued functional use of LUE. Educated pt and wife on signs and symptoms of stroke and need to return to hospital if experiencing symptoms.       Vision                     Perception     Praxis      Cognition   Behavior During Therapy: WFL for tasks assessed/performed Overall Cognitive Status: Within Functional Limits for tasks assessed                       Extremity/Trunk Assessment               Exercises     Shoulder Instructions       General Comments      Pertinent Vitals/ Pain       Pain Assessment: No/denies pain  Home Living                                          Prior Functioning/Environment              Frequency  Progress Toward Goals  OT Goals(current goals can now be found in the care plan section)  Progress towards OT goals: Goals met/education completed, patient discharged from OT  Acute Rehab OT Goals Patient Stated Goal: home today OT Goal Formulation: All assessment and education complete, DC therapy  Plan Discharge plan needs to be updated    Co-evaluation                 End of Session     Activity Tolerance Patient tolerated treatment well   Patient Left in bed;with call bell/phone within reach;with family/visitor present   Nurse Communication          Time: 4932-4199 OT Time Calculation (min): 12 min  Charges: OT General Charges $OT Visit: 1 Procedure OT Treatments $Therapeutic Activity: 8-22 mins  Binnie Kand M.S., OTR/L Pager: [phone]  10/05/2016, 9:12 AM

## 2016-10-05 NOTE — Care Management Note (Signed)
Case Management Note  Patient Details  Name: Ernest RosenthalJerry Franco MRN: [id number] Date of Birth: 06/07/1971  Subjective/Objective:                    Action/Plan: Pts wife able to provide card for [employer] insurance. Card copied and faxed to financial counseling.   Expected Discharge Date:  10/04/16               Expected Discharge Plan:  Home/Self Care  In-House Referral:     Discharge planning Services  CM Consult  Post Acute Care Choice:    Choice offered to:     DME Arranged:    DME Agency:     HH Arranged:    HH Agency:     Status of Service:  Completed, signed off  If discussed at [employer] Length of Stay Meetings, dates discussed:    Additional Comments:  Kermit BaloKelli F Alyssamarie Mounsey, RN 10/05/2016, 11:11 AM

## 2016-10-05 NOTE — Progress Notes (Signed)
Patient vomiting, diaphoretic, h/a 10/10. MD notified.

## 2016-10-05 NOTE — Progress Notes (Signed)
Facilities notified of a/c unit sudden increased noise. RN spoke with technician, who states he would be by to check unit/fan in 5 M12 in 10-15 minutes. Patient and wife notified.

## 2016-10-05 NOTE — Discharge Summary (Signed)
Physician Discharge Summary  Hearl Heikes ZOX:[id number] DOB: [birth year] DOA: 10/02/2016  PCP: Cheral Bay, MD  Admit date: 10/02/2016 Discharge date: 10/05/2016  Admitted From: Home Disposition:  Home  Recommendations for Outpatient Follow-up:  1. Follow up with PCP in 1-2 weeks 2. Start taking Metformin as prescribed 3. Set up appointment with Cardiology as needed 4. Think of healthier eating options 5. Set up outpatient OT  Home Health: No Equipment/Devices: None  Discharge Condition: Stable and improved CODE STATUS:Full Code Diet recommendation: Heart Healthy / Carb Modified   Brief/Interim Summary: Edsel Shives a 45 y.o.gentleman with a history of chronic LE edema managed with lasix and acid reflux (he is protonix at home) who presented to the ED in [facility] for acute onset left arm weakness with numbness that started around 8:30PM. Patient was sitting on the couch talking to his son when he first noticed the symptoms. He did not take anything in an attempt to alleviate his symptoms. He actually ate dinner before heading to the ED in [facility] for evaluation. Around 9PM, while en route to the ED, his wife noticed slurred speech. The patient was triaged in [facility] and transferred emergently to [facility] as a CODE STROKE. The patient also reports a history of intermittent episodes of chest pain for the past several weeks. The pain has occurred at rest and with exertion. He describes it as a substernal heaviness that sometimes radiates into his neck or down his left arm and is associated with diaphoresis. No significant nausea or vomiting. No light-headness, dizziness, or syncope. The pain typically lasts for several minutes before resolving on its own without specific intervention. The patient was waiting to see his PCP at the end of the month for these symptoms. He does not have active chest pain right now in the ED. No history of falls, head trauma, or LOC. He was been  evaluated by neurology in the emergency department. Head CT negative for acute CVA. He has received full strength aspirin. EKG negative for acute ST elevations. First troponin negative. Chest xray pending. Labs notable for K of 3.2 and hyperglycemia with random blood sugar of 269.  Cardiology consulted and state patient can start a low dose Imdur and follow up outpatient if pain persists to discuss cath.  Patient developed an extreme migraine on 10/5 which was difficult to manage. He experienced two episodes of emesis and ultimate his migraine broke with migraine cocktail.  Discharge Diagnoses:  Principal Problem:   TIA (transient ischemic attack) Active Problems:   Chest pain   Hyperglycemia   Elevated blood pressure reading   Chronic edema   Muscle weakness of left upper extremity   CVA (cerebral vascular accident) [facility])    Discharge Instructions  Discharge Instructions    Call MD for:  difficulty breathing, headache or visual disturbances    Complete by:  As directed    Call MD for:  difficulty breathing, headache or visual disturbances    Complete by:  As directed    Call MD for:  extreme fatigue    Complete by:  As directed    Call MD for:  extreme fatigue    Complete by:  As directed    Call MD for:  persistant dizziness or light-headedness    Complete by:  As directed    Call MD for:  persistant dizziness or light-headedness    Complete by:  As directed    Call MD for:  persistant nausea and vomiting  Complete by:  As directed    Call MD for:  persistant nausea and vomiting    Complete by:  As directed    Call MD for:  severe uncontrolled pain    Complete by:  As directed    Call MD for:  severe uncontrolled pain    Complete by:  As directed    Call MD for:  temperature >100.4    Complete by:  As directed    Diet - low sodium heart healthy    Complete by:  As directed    Diet - low sodium heart healthy    Complete by:  As directed    Increase activity slowly     Complete by:  As directed    Increase activity slowly    Complete by:  As directed        Medication List    TAKE these medications   aspirin 325 MG tablet Take 1 tablet (325 mg total) by mouth daily.   atorvastatin 10 MG tablet Commonly known as:  LIPITOR Take 1 tablet (10 mg total) by mouth daily at 6 PM.   furosemide 20 MG tablet Commonly known as:  LASIX Take 20 mg by mouth 2 (two) times daily.   isosorbide mononitrate 30 MG 24 hr tablet Commonly known as:  IMDUR Take 1 tablet (30 mg total) by mouth daily.   metFORMIN 500 MG tablet Commonly known as:  GLUCOPHAGE Take 1 tablet (500 mg total) by mouth 2 (two) times daily with a meal.   omeprazole 20 MG capsule Commonly known as:  PRILOSEC Take 40 mg by mouth every morning.      Follow-up Information    Cheral Bay, MD. Schedule an appointment as soon as possible for a visit in 1 week(s).   Specialty:  Family Medicine Contact information: [address] [facility] [address] [phone]        Tonny Bollman, MD. Schedule an appointment as soon as possible for a visit in 1 month(s).   Specialty:  Cardiology Contact information: [address] [place] [address] [phone]          No Known Allergies  Consultations:  Neurology  Cardiology  PT  OT   Procedures/Studies: Ct Angio Head W Or Wo Contrast  Result Date: 10/03/2016 CLINICAL DATA:  LEFT upper extremity weakness beginning yesterday, slowly improving. History of hypertension. EXAM: CT ANGIOGRAPHY HEAD AND NECK TECHNIQUE: Multidetector CT imaging of the head and neck was performed using the standard protocol during bolus administration of intravenous contrast. Multiplanar CT image reconstructions and MIPs were obtained to evaluate the vascular anatomy. Carotid stenosis measurements (when applicable) are obtained utilizing NASCET criteria, using the distal internal carotid diameter as  the denominator. CTA NECK performed at 1646 hours. Patient returned at 2153 hours for CTA head. CONTRAST:  100 cc Isovue 370 COMPARISON:  CT HEAD October 02, 2016 FINDINGS: Large body habitus results in overall noisy image quality. CTA NECK- delayed phase, greater venous than arterial contrast. AORTIC ARCH: Normal appearance of the thoracic arch, 2 vessel aortic arch. The origins of the innominate, left Common carotid artery and subclavian artery are patent. RIGHT CAROTID SYSTEM: Common carotid artery appears patent, coursing in a straight line fashion though, no significant intra-arterial contrast. Trace calcific atherosclerosis of the carotid bulb, no definite stenosis. LEFT CAROTID SYSTEM: Common carotid artery appears patent, coursing in a straight line fashion though, no significant intra-arterial contrast. Trace calcific atherosclerosis of the  carotid bulb, no definite stenosis. VERTEBRAL ARTERIES:Nondiagnostic as there is no significant arterial contrast opacification. SKELETON: No acute osseous process though bone windows have not been submitted. OTHER NECK: Soft tissues of the neck are non-acute though, not tailored for evaluation. CTA HEAD- limited by venous contamination. ANTERIOR CIRCULATION: Patent cervical internal carotid arteries, petrous, cavernous and supra clinoid internal carotid arteries though limited assessment due to cavernous sinus venous opacification. Widely patent anterior communicating artery. Normal appearance of the anterior and middle cerebral arteries. Diffuse mild luminal regularity. No definite aneurysm or high-grade stenosis. POSTERIOR CIRCULATION: Patent vertebral arteries, vertebrobasilar junction and basilar artery, as well as main branch vessels. Normal appearance of the posterior cerebral arteries. Bilateral posterior communicating arteries present. Diffuse mild luminal irregularity. No definite aneurysm or high-grade stenosis. VENOUS SINUSES: Major dural venous sinuses are  patent though not tailored for evaluation on this angiographic examination. ANATOMIC VARIANTS: None. DELAYED PHASE: No abnormal intracranial enhancement. IMPRESSION: CTA NECK: Habitus limited examination, further degraded by delayed phase. Nondiagnostic for assessment of vertebral arteries. Node definite hemodynamically significant stenosis. CTA HEAD: Habitus limited examination, further degraded by venous contamination. Diffuse mild cerebral artery luminal irregularity favoring artifact, less likely atherosclerosis or vasculopathy. No emergent large vessel occlusion or definite high-grade stenosis. Electronically Signed   By: Awilda Metro M.D.   On: 10/03/2016 23:03   Dg Chest 1 View  Result Date: 10/03/2016 CLINICAL DATA:  45 year old male with a history of chest pain EXAM: CHEST 1 VIEW COMPARISON:  None. FINDINGS: The heart size and mediastinal contours are within normal limits. Both lungs are clear. The visualized skeletal structures are unremarkable. IMPRESSION: No radiographic evidence of acute cardiopulmonary disease. Signed, Yvone Neu. Loreta Ave, DO [facility]iology Electronically Signed   By: Gilmer Mor D.O.   On: 10/03/2016 15:23   Ct Head Wo Contrast  Result Date: 10/02/2016 CLINICAL DATA:  Left-sided weakness and slurred speech for 2.3 hours. EXAM: CT HEAD WITHOUT CONTRAST TECHNIQUE: Contiguous axial images were obtained from the base of the skull through the vertex without intravenous contrast. COMPARISON:  None. FINDINGS: Brain: No evidence of acute infarction, hemorrhage, hydrocephalus, extra-axial collection or mass lesion/mass effect. Vascular: No hyperdense vessel or unexpected calcification. Skull: Normal. Negative for fracture or focal lesion. Sinuses/Orbits: No acute finding. Other: None. IMPRESSION: No acute intracranial abnormality. Acute infarcts may be CT occult in the first 24 hours. These results were called by telephone at  the time of interpretation on 10/02/2016 at 10:32 pm to Dr. Marily Memos , who verbally acknowledged these results. Electronically Signed   By: Tollie Eth M.D.   On: 10/02/2016 22:32   Ct Angio Neck W Or Wo Contrast  Result Date: 10/03/2016 CLINICAL DATA:  LEFT upper extremity weakness beginning yesterday, slowly improving. History of hypertension. EXAM: CT ANGIOGRAPHY HEAD AND NECK TECHNIQUE: Multidetector CT imaging of the head and neck was performed using the standard protocol during bolus administration of intravenous contrast. Multiplanar CT image reconstructions and MIPs were obtained to evaluate the vascular anatomy. Carotid stenosis measurements (when applicable) are obtained utilizing NASCET criteria, using the distal internal carotid diameter as the denominator. CTA NECK performed at 1646 hours. Patient returned at 2153 hours for CTA head. CONTRAST:  100 cc Isovue 370 COMPARISON:  CT HEAD October 02, 2016 FINDINGS: Large body habitus results in overall noisy image quality. CTA NECK- delayed phase, greater venous than arterial contrast. AORTIC ARCH: Normal appearance of the thoracic arch, 2 vessel aortic arch. The origins of the innominate,  left Common carotid artery and subclavian artery are patent. RIGHT CAROTID SYSTEM: Common carotid artery appears patent, coursing in a straight line fashion though, no significant intra-arterial contrast. Trace calcific atherosclerosis of the carotid bulb, no definite stenosis. LEFT CAROTID SYSTEM: Common carotid artery appears patent, coursing in a straight line fashion though, no significant intra-arterial contrast. Trace calcific atherosclerosis of the carotid bulb, no definite stenosis. VERTEBRAL ARTERIES:Nondiagnostic as there is no significant arterial contrast opacification. SKELETON: No acute osseous process though bone windows have not been submitted. OTHER NECK: Soft tissues of the neck are non-acute though, not tailored for evaluation. CTA HEAD- limited by  venous contamination. ANTERIOR CIRCULATION: Patent cervical internal carotid arteries, petrous, cavernous and supra clinoid internal carotid arteries though limited assessment due to cavernous sinus venous opacification. Widely patent anterior communicating artery. Normal appearance of the anterior and middle cerebral arteries. Diffuse mild luminal regularity. No definite aneurysm or high-grade stenosis. POSTERIOR CIRCULATION: Patent vertebral arteries, vertebrobasilar junction and basilar artery, as well as main branch vessels. Normal appearance of the posterior cerebral arteries. Bilateral posterior communicating arteries present. Diffuse mild luminal irregularity. No definite aneurysm or high-grade stenosis. VENOUS SINUSES: Major dural venous sinuses are patent though not tailored for evaluation on this angiographic examination. ANATOMIC VARIANTS: None. DELAYED PHASE: No abnormal intracranial enhancement. IMPRESSION: CTA NECK: Habitus limited examination, further degraded by delayed phase. Nondiagnostic for assessment of vertebral arteries. Node definite hemodynamically significant stenosis. CTA HEAD: Habitus limited examination, further degraded by venous contamination. Diffuse mild cerebral artery luminal irregularity favoring artifact, less likely atherosclerosis or vasculopathy. No emergent large vessel occlusion or definite high-grade stenosis. Electronically Signed   By: Awilda Metro M.D.   On: 10/03/2016 23:03      Subjective: Patient reports he had a few episodes of emesis last night but reports that he feels 100% better this morning.  Also states that he feels better today than he has in days.  Reports he is ready to discharge and that he needs to bring his wife out for lunch as her birthday was 2 days ago.  Denies chest pain, chest pressure, shortness of breath, change in vision, headache, abdominal pain, nausea, diarrhea.  Discussed at length healthy eating habits and encouraged patient to  follow up with his primary care physician to further manage his new onset diabetes mellitus type II.  Discharge Exam: Vitals:   10/05/16 0104 10/05/16 0627  BP: 136/75 110/60  Pulse: 79 74  Resp: 18 20  Temp: 97.5 F (36.4 C) 97.8 F (36.6 C)   Vitals:   10/04/16 2143 10/04/16 2232 10/05/16 0104 10/05/16 0627  BP: 131/73  136/75 110/60  Pulse: 70  79 74  Resp: 18 18 18 20   Temp: 98.1 F (36.7 C)  97.5 F (36.4 C) 97.8 F (36.6 C)  TempSrc: Oral  Oral Oral  SpO2: 96%  97% 97%  Weight:      Height:        General: Pt is alert, awake, not in acute distress Cardiovascular: RRR, S1/S2 +, no rubs, no gallops Respiratory: CTA bilaterally, no wheezing, no rhonchi Abdominal: Soft, NT, ND, bowel sounds + Extremities: no edema, no cyanosis    The results of significant diagnostics from this hospitalization (including imaging, microbiology, ancillary and laboratory) are listed below for reference.     Microbiology: No results found for this or any previous visit (from the past 240 hour(s)).   Labs: BNP (last 3 results) No results for input(s): BNP in the last 8760 hours. Basic  Metabolic Panel:  Recent Labs Lab 10/02/16 2204 10/05/16 0605  NA 137 137  K 3.2* 4.2  CL 101 103  CO2 28 27  GLUCOSE 269* 187*  BUN 15 13  CREATININE 1.09 1.06  CALCIUM 8.7* 8.8*   Liver Function Tests:  Recent Labs Lab 10/02/16 2204  AST 36  ALT 47  ALKPHOS 67  BILITOT 0.9  PROT 8.0  ALBUMIN 4.0   No results for input(s): LIPASE, AMYLASE in the last 168 hours. No results for input(s): AMMONIA in the last 168 hours. CBC:  Recent Labs Lab 10/02/16 2204  WBC 9.4  NEUTROABS 6.0  HGB 15.4  HCT 43.9  MCV 87.3  PLT 209   Cardiac Enzymes:  Recent Labs Lab 10/02/16 2204 10/03/16 0155 10/03/16 0715 10/03/16 1518  TROPONINI <0.03 <0.03 <0.03 <0.03   BNP: Invalid input(s): POCBNP CBG:  Recent Labs Lab 10/04/16 0652 10/04/16 1139 10/04/16 1616 10/04/16 2156  10/05/16 0608  GLUCAP 177* 226* 243* 185* 178*   D-Dimer No results for input(s): DDIMER in the last 72 hours. Hgb A1c  Recent Labs  10/03/16 0155  HGBA1C 8.1*   Lipid Profile  Recent Labs  10/03/16 0155  CHOL 166  HDL 25*  LDLCALC 98  TRIG 409216*  CHOLHDL 6.6   Thyroid function studies No results for input(s): TSH, T4TOTAL, T3FREE, THYROIDAB in the last 72 hours.  Invalid input(s): FREET3 Anemia work up No results for input(s): VITAMINB12, FOLATE, FERRITIN, TIBC, IRON, RETICCTPCT in the last 72 hours. Urinalysis No results found for: COLORURINE, APPEARANCEUR, LABSPEC, PHURINE, GLUCOSEU, HGBUR, BILIRUBINUR, KETONESUR, PROTEINUR, UROBILINOGEN, NITRITE, LEUKOCYTESUR Sepsis Labs Invalid input(s): PROCALCITONIN,  WBC,  LACTICIDVEN Microbiology No results found for this or any previous visit (from the past 240 hour(s)).   Time coordinating discharge: Less than 30 minutes  SIGNED:   Bennett ScrapeAlex Elsye Mccollister, MD  [facility] 10/05/2016, 9:58 AM Pager [phone] If 7PM-7AM, please contact night-coverage [email or web address] Password [email or web address]

## 2016-10-05 NOTE — Discharge Instructions (Signed)
Aspirin and Your Heart  Aspirin is a medicine that affects the way blood clots. Aspirin can be used to help reduce the risk of blood clots, heart attacks, and other heart-related problems.  SHOULD I TAKE ASPIRIN? Your health care provider will help you determine whether it is safe and beneficial for you to take aspirin daily. Taking aspirin daily may be beneficial if you:  Have had a heart attack or chest pain.  Have undergone open heart surgery such as coronary artery bypass surgery (CABG).  Have had coronary angioplasty.  Have experienced a stroke or transient ischemic attack (TIA).  Have peripheral vascular disease (PVD).  Have chronic heart rhythm problems such as atrial fibrillation. ARE THERE ANY RISKS OF TAKING ASPIRIN DAILY? Daily use of aspirin can increase your risk of side effects. Some of these include:  Bleeding. Bleeding problems can be minor or serious. An example of a minor problem is a cut that does not stop bleeding. An example of a more serious problem is stomach bleeding or bleeding into the brain. Your risk of bleeding is increased if you are also taking non-steroidal anti-inflammatory medicine (NSAIDs).  Increased bruising.  Upset stomach.  An allergic reaction. People who have nasal polyps have an increased risk of developing an aspirin allergy. WHAT ARE SOME GUIDELINES I SHOULD FOLLOW WHEN TAKING ASPIRIN?   Take aspirin only as directed by your health care provider. Make sure you understand how much you should take and what form you should take. The two forms of aspirin are:  Non-enteric-coated. This type of aspirin does not have a coating and is absorbed quickly. Non-enteric-coated aspirin is usually recommended for people with chest pain. This type of aspirin also comes in a chewable form.  Enteric-coated. This type of aspirin has a special coating that releases the medicine very slowly. Enteric-coated aspirin causes less stomach upset than non-enteric-coated  aspirin. This type of aspirin should not be chewed or crushed.  Drink alcohol in moderation. Drinking alcohol increases your risk of bleeding. WHEN SHOULD I SEEK MEDICAL CARE?   You have unusual bleeding or bruising.  You have stomach pain.  You have an allergic reaction. Symptoms of an allergic reaction include:  Hives.  Itchy skin.  Swelling of the lips, tongue, or face.  You have ringing in your ears. WHEN SHOULD I SEEK IMMEDIATE MEDICAL CARE?   Your bowel movements are bloody, dark red, or black in color.  You vomit or cough up blood.  You have blood in your urine.  You cough, wheeze, or feel short of breath. If you have any of the following symptoms, this is an emergency. Do not wait to see if the pain will go away. Get medical help at once. Call your local emergency services (911 in the [place] Do not drive yourself to the hospital.  You have severe chest pain, especially if the pain is crushing or pressure-like and spreads to the arms, back, neck, or jaw.  You have stroke-like symptoms, such as:   Loss of vision.   Difficulty talking.   Numbness or weakness on one side of your body.   Numbness or weakness in your arm or leg.   Not thinking clearly or feeling confused.    This information is not intended to replace advice given to you by your health care provider. Make sure you discuss any questions you have with your health care provider.   Document Released: 11/29/2008 Document Revised: 01/07/2015 Document Reviewed: 03/24/2014 Elsevier Interactive Patient Education 2016 Elsevier   Inc.  

## 2016-10-05 NOTE — Progress Notes (Signed)
Patient administered migraine cocktail, please see MAR,per Dr. Roseanne RenoStewart. RN will continue to monitor.

## 2016-10-09 LAB — PROTHROMBIN GENE MUTATION

## 2016-10-09 LAB — FACTOR 5 LEIDEN

## 2016-11-14 ENCOUNTER — Telehealth: Payer: Self-pay | Admitting: Neurology

## 2016-11-14 NOTE — Telephone Encounter (Signed)
Rn call patients wife that there is no DPR on file to speak with her. [facility]. Rn requested pts cell number. PTs wife stated he was at work. Number given of [phone].

## 2016-11-14 NOTE — Telephone Encounter (Signed)
Patient returned Katrina's call, skyped nurse Katrina, relayed message to patient that nurse needs to speak w/Dr. Pearlean BrownieSethi and Dr. Pearlean BrownieSethi is working at the hospital this week, asked patient per Katrina if this can wait until Monday, patient states yes, this can wait until Monday. Please call [phone] after speaking with Dr. Pearlean BrownieSethi.

## 2016-11-14 NOTE — Telephone Encounter (Signed)
Patient's wife is calling. The patient was discharged from the hospital on 10-05-16 and saw Dr. Pearlean BrownieSethi while in the hospital. He was not given any medication and was told a follow up appointment was not needed. 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  The patient did follow up with his PCP as stated in the discharge notes. Please call and discuss.

## 2016-11-20 NOTE — Telephone Encounter (Signed)
Pt said he was following up regarding getting letter for CDL's. Katrina was skyped, advised she will need a fax number and name. Advised Dr Pearlean BrownieSethi will have the letter ready late this afternoon or sooner. Pt said he wants the letter faxed and will call back with a name and number.

## 2016-11-20 NOTE — Telephone Encounter (Signed)
Patient had transient left hand weakness and numbness episode which was probably migraine related. He can return to work without restrictions as he has no deficits.

## 2016-11-20 NOTE — Telephone Encounter (Signed)
ok 

## 2016-11-20 NOTE — Telephone Encounter (Signed)
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

## 2016-11-20 NOTE — Telephone Encounter (Signed)
I have reviewed the patient's hospital chart. He had no significant residual deficits. The patient may return back to work without restrictions.

## 2016-11-21 NOTE — Telephone Encounter (Signed)
Letter fax to [phone]. Letter fax twice and receive.

## 2016-11-21 NOTE — Telephone Encounter (Signed)
Fax [phone]  Pt called in to check on letter. He has not received it yet.

## 2018-01-01 ENCOUNTER — Other Ambulatory Visit: Payer: Self-pay

## 2018-01-01 ENCOUNTER — Emergency Department (HOSPITAL_BASED_OUTPATIENT_CLINIC_OR_DEPARTMENT_OTHER): Payer: Managed Care, Other (non HMO)

## 2018-01-01 ENCOUNTER — Encounter (HOSPITAL_BASED_OUTPATIENT_CLINIC_OR_DEPARTMENT_OTHER): Payer: Self-pay

## 2018-01-01 ENCOUNTER — Emergency Department (HOSPITAL_BASED_OUTPATIENT_CLINIC_OR_DEPARTMENT_OTHER)
Admission: EM | Admit: 2018-01-01 | Discharge: 2018-01-02 | Disposition: A | Discharge status: Home / Self Care | Payer: Managed Care, Other (non HMO) | Attending: Emergency Medicine | Admitting: Emergency Medicine

## 2018-01-01 DIAGNOSIS — Z7982 Long term (current) use of aspirin: Secondary | ICD-10-CM | POA: Diagnosis not present

## 2018-01-01 DIAGNOSIS — Z79899 Other long term (current) drug therapy: Secondary | ICD-10-CM | POA: Insufficient documentation

## 2018-01-01 DIAGNOSIS — I10 Essential (primary) hypertension: Secondary | ICD-10-CM | POA: Insufficient documentation

## 2018-01-01 DIAGNOSIS — L0211 Cutaneous abscess of neck: Secondary | ICD-10-CM | POA: Insufficient documentation

## 2018-01-01 LAB — CBC WITH DIFFERENTIAL/PLATELET
BASOS PCT: 0 %
Basophils Absolute: 0 10*3/uL (ref 0.0–0.1)
EOS ABS: 0.3 10*3/uL (ref 0.0–0.7)
EOS PCT: 3 %
HCT: 44.5 % (ref 39.0–52.0)
HEMOGLOBIN: 15.3 g/dL (ref 13.0–17.0)
LYMPHS PCT: 24 %
Lymphs Abs: 2.1 10*3/uL (ref 0.7–4.0)
MCH: 29.5 pg (ref 26.0–34.0)
MCHC: 34.4 g/dL (ref 30.0–36.0)
MCV: 85.7 fL (ref 78.0–100.0)
MONO ABS: 0.8 10*3/uL (ref 0.1–1.0)
Monocytes Relative: 9 %
NEUTROS PCT: 64 %
Neutro Abs: 5.4 10*3/uL (ref 1.7–7.7)
PLATELETS: 201 10*3/uL (ref 150–400)
RBC: 5.19 MIL/uL (ref 4.22–5.81)
RDW: 12.5 % (ref 11.5–15.5)
WBC: 8.6 10*3/uL (ref 4.0–10.5)

## 2018-01-01 LAB — COMPREHENSIVE METABOLIC PANEL
ALT: 47 U/L (ref 17–63)
AST: 36 U/L (ref 15–41)
Albumin: 3.4 g/dL — ABNORMAL LOW (ref 3.5–5.0)
Alkaline Phosphatase: 89 U/L (ref 38–126)
Anion gap: 8 (ref 5–15)
BUN: 10 mg/dL (ref 6–20)
CHLORIDE: 100 mmol/L — AB (ref 101–111)
CO2: 27 mmol/L (ref 22–32)
Calcium: 9.2 mg/dL (ref 8.9–10.3)
Creatinine, Ser: 0.79 mg/dL (ref 0.61–1.24)
Glucose, Bld: 357 mg/dL — ABNORMAL HIGH (ref 65–99)
Potassium: 4.2 mmol/L (ref 3.5–5.1)
SODIUM: 135 mmol/L (ref 135–145)
Total Bilirubin: 0.5 mg/dL (ref 0.3–1.2)
Total Protein: 7.8 g/dL (ref 6.5–8.1)

## 2018-01-01 MED ORDER — OXYCODONE-ACETAMINOPHEN 5-325 MG PO TABS
1.0000 | ORAL_TABLET | Freq: Once | ORAL | Status: AC
  Administered 2018-01-01: 1 via ORAL
  Filled 2018-01-01: qty 1

## 2018-01-01 MED ORDER — IOPAMIDOL (ISOVUE-300) INJECTION 61%
100.0000 mL | Freq: Once | INTRAVENOUS | Status: AC | PRN
  Administered 2018-01-01: 100 mL via INTRAVENOUS

## 2018-01-01 MED ORDER — SODIUM CHLORIDE 0.9 % IV BOLUS (SEPSIS)
1000.0000 mL | Freq: Once | INTRAVENOUS | Status: AC
  Administered 2018-01-01: 1000 mL via INTRAVENOUS

## 2018-01-01 NOTE — ED Notes (Signed)
ED Provider at bedside. 

## 2018-01-01 NOTE — ED Triage Notes (Signed)
C/o abscess to posterior neck x 9-10 days-NAD-steady gait

## 2018-01-01 NOTE — ED Provider Notes (Signed)
[facility] EMERGENCY DEPARTMENT Provider Note   CSN: [id number] Arrival date & time: 01/01/18  1857     History   Chief Complaint Chief Complaint  Patient presents with  . Abscess    HPI Ernest Franco is a 47 y.o. male.  47 year old male with past medical history including CVA, hypertension, morbid obesity who presents with posterior neck abscess.  9-10 days ago, the patient had a small area of swelling on the back of his neck.  [place] and he delayed seeking medical attention until today.  The area has gotten progressively larger and more painful.  His pain is currently severe and constant because of this he can barely move his neck.  He denies any fevers, vomiting, or recent illness.  He states he is a "borderline" diabetic.  No medications prior to arrival.   The history is provided by the patient.  Abscess    Past Medical History:  Diagnosis Date  . Essential hypertension   . Morbid obesity [facility](HCC)     Patient Active Problem List   Diagnosis Date Noted  . CVA (cerebral vascular accident) (HCC) 10/04/2016  . Chest pain 10/03/2016  . Hyperglycemia 10/03/2016  . Elevated blood pressure reading 10/03/2016  . Chronic edema 10/03/2016  . Muscle weakness of left upper extremity 10/03/2016  . TIA (transient ischemic attack) 10/02/2016    Past Surgical History:  Procedure Laterality Date  . CHOLECYSTECTOMY    . KNEE SURGERY         Home Medications    Prior to Admission medications   Medication Sig Start Date End Date Taking? Authorizing Provider  GLIPIZIDE PO Take by mouth.   Yes [provider]  aspirin 325 MG tablet Take 1 tablet (325 mg total) by mouth daily. 10/05/16   Filbert SchilderKadolph, Alexandria U, MD  clindamycin (CLEOCIN) 150 MG capsule Take 2 capsules (300 mg total) by mouth 4 (four) times daily for 7 days. 01/02/18 01/09/18  Little, Ambrose Finlandachel Morgan, MD  furosemide (LASIX) 20 MG tablet Take 20 mg by mouth 2 (two) times  daily.    [provider]  oxyCODONE-acetaminophen (PERCOCET) 5-325 MG tablet Take 1 tablet by mouth every 4 (four) hours as needed for severe pain. 01/02/18   Little, Ambrose Finlandachel Morgan, MD    Family History No family history on file.  Social History Social History   Tobacco Use  . Smoking status: Never Smoker  . Smokeless tobacco: Never Used  Substance Use Topics  . Alcohol use: No  . Drug use: No     Allergies   Patient has no known allergies.   Review of Systems Review of Systems All other systems reviewed and are negative except that which was mentioned in HPI   Physical Exam Updated Vital Signs BP (!) 144/71 (BP Location: Left Arm)   Pulse 70   Temp 98.3 F (36.8 C) (Oral)   Resp 16   Ht 5\' 7"  (1.702 m)   Wt (!) 174.6 kg (385 lb)   SpO2 95%   BMI 60.30 kg/m   Physical Exam  Constitutional: He is oriented to person, place, and time. He appears well-developed and well-nourished. No distress.  HENT:  Head: Normocephalic and atraumatic.  Moist mucous membranes  Eyes: Conjunctivae are normal. Pupils are equal, round, and reactive to light.  Neck:  Large area of induration and erythema on posterior base of neck extending onto back and upward towards base of skull  Cardiovascular: Normal rate,  regular rhythm and normal heart sounds.  No murmur heard. Pulmonary/Chest: Effort normal and breath sounds normal. No stridor.  Abdominal: Soft. Bowel sounds are normal. He exhibits no distension. There is no tenderness.  Musculoskeletal: He exhibits no edema.  Neurological: He is alert and oriented to person, place, and time.  Fluent speech  Skin: Skin is warm and dry.  Psychiatric: He has a normal mood and affect. Judgment normal.  Nursing note and vitals reviewed.    ED Treatments / Results  Labs (all labs ordered are listed, but only abnormal results are displayed) Labs Reviewed  COMPREHENSIVE METABOLIC PANEL - Abnormal; Notable for the following  components:      Result Value   Chloride 100 (*)    Glucose, Bld 357 (*)    Albumin 3.4 (*)    All other components within normal limits  CBC WITH DIFFERENTIAL/PLATELET    EKG  EKG Interpretation None       Radiology Ct Soft Tissue Neck W Contrast  Result Date: 01/02/2018 CLINICAL DATA:  Initial evaluation for acute posterior midline neck abscess. EXAM: CT NECK WITH CONTRAST TECHNIQUE: Multidetector CT imaging of the neck was performed using the standard protocol following the bolus administration of intravenous contrast. CONTRAST:  ISOVUE-300 IOPAMIDOL (ISOVUE-300) INJECTION 61% COMPARISON:  Prior CT from 10/03/2016. FINDINGS: Pharynx and larynx: Oral cavity within normal limits without mass lesion or loculated fluid collection. No acute abnormality about the dentition. Palatine tonsils symmetric and normal. Parapharyngeal fat preserved. Nasopharynx normal. Retropharyngeal soft tissues within normal limits. Epiglottis normal. Vallecula largely clear. Remainder of the hypopharynx and supraglottic larynx within normal limits. True cords symmetric and normal. Subglottic airway clear. Salivary glands: Salivary glands are normal. Thyroid: Partially visualized thyroid normal. Lymph nodes: Mildly prominent 11 mm left suboccipital lymph node (series 3, image 27), likely reactive. No other adenopathy within the neck. Vascular: Normal intravascular enhancement seen throughout the neck. Mild atherosclerotic change about the carotid bifurcations. Limited intracranial: Unremarkable. Visualized orbits: Partially visualized globes and orbital soft tissues within normal limits. Mastoids and visualized paranasal sinuses: Small left sphenoid sinus retention cyst noted. Visualized paranasal sinuses are otherwise clear. Mastoid air cells and middle ear cavities are well pneumatized and clear. Skeleton: No acute osseous abnormality. No worrisome lytic or blastic osseous lesions. Mild degenerate spondylolysis  noted within the visualized cervical spine with small disc bulge at C3-4. Upper chest: Partially visualized upper chest within normal limits. Other: Focal inflammatory changes seen just to the left of midline within the subcutaneous fat of the posterior neck. Changes are largely confined to the subcutaneous fat without deeper extension into the paraspinous musculature at this time. Prominent overlying skin thickening. There is a superimposed thick rimmed collection within this area measuring 2.2 x 1.9 x 1.8 cm, consistent with abscess (series 3, image 58). This is positioned just to the left of midline, and up to 2.2 cm deep to the skin. Lesion courses more superficially and closely approximates the overlying skin at its superior aspect, best appreciated on sagittal sequence (series 8, image 66). IMPRESSION: 2.2 x 1.9 x 1.8 cm abscess within the subcutaneous fat positioned just to the left of midline and up to 2.2 cm deep to the skin at the posterior neck as above. Surrounding inflammatory changes consistent with cellulitis. Inflammatory process largely confined to the subcutaneous fat at this time, without deeper extension into the underlying paraspinous musculature. Electronically Signed   By: Rise Mu M.D.   On: 01/02/2018 00:44    Procedures  Procedures (including critical care time)  Medications Ordered in ED Medications  clindamycin (CLEOCIN) IVPB 600 mg (not administered)  ketorolac (TORADOL) 30 MG/ML injection 30 mg (not administered)  oxyCODONE-acetaminophen (PERCOCET/ROXICET) 5-325 MG per tablet 1 tablet (1 tablet Oral Given 01/01/18 2206)  sodium chloride 0.9 % bolus 1,000 mL (0 mLs Intravenous Stopped 01/01/18 2330)  iopamidol (ISOVUE-300) 61 % injection 100 mL (100 mLs Intravenous Contrast Given 01/01/18 2305)     Initial Impression / Assessment and Plan / ED Course  I have reviewed the triage vital signs and the nursing notes.  Pertinent labs & imaging results that were  available during my care of the patient were reviewed by me and considered in my medical decision making (see chart for details).     Large posterior neck abscess. Non-toxic on exam, no airway compromise, stable VS. Difficult to fully assess extent of infection due to patient's body habitus. Attempted bedside US of area but unable to visualize deep enough below skin. Obtained CT neck for better evaluation.   Labs show glucose 357 with normal creatinine and no evidence of DKA, normal CBC.  CT confirms abscess within subcutaneous fat with surrounding cellulitic changes.  Discussed imaging with radiologist.  Given how deep this abscess is and its location, contacted general surgery and discussed with Dr. Donell Beers, appreciate assistance. Because pt is well appearing, afebrile with normal WBC count and no respiratory compromise, she has recommended antibiotics and f/u in gen surgery clinic in the morning for drainage. I feel this is an appropriate plan based on stable VS, well appearance. I have given IV clindamycin here, Rx for clinda and pain medications. Have discussed follow-up plan at length with patient and his wife and they have voiced understanding.  Instructed to report directly to [facility] or [facility] if his symptoms worsen or he develops any new symptoms such as breathing problems, fever.   Final Clinical Impressions(s) / ED Diagnoses   Final diagnoses:  Abscess of skin of neck    ED Discharge Orders        Ordered    clindamycin (CLEOCIN) 150 MG capsule  4 times daily     01/02/18 0056    oxyCODONE-acetaminophen (PERCOCET) 5-325 MG tablet  Every 4 hours PRN     01/02/18 0056       Little, Ambrose Finland, MD 01/02/18 0100

## 2018-01-02 MED ORDER — CLINDAMYCIN HCL 150 MG PO CAPS: 300.0000 mg | ORAL_CAPSULE | Freq: Four times a day (QID) | ORAL | 0 refills | Status: DC

## 2018-01-02 MED ORDER — KETOROLAC TROMETHAMINE 30 MG/ML IJ SOLN
30.0000 mg | Freq: Once | INTRAMUSCULAR | Status: AC
  Administered 2018-01-02: 30 mg via INTRAVENOUS
  Filled 2018-01-02: qty 1

## 2018-01-02 MED ORDER — OXYCODONE-ACETAMINOPHEN 5-325 MG PO TABS: 1.0000 | ORAL_TABLET | ORAL | 0 refills | Status: DC | PRN

## 2018-01-02 MED ORDER — CLINDAMYCIN PHOSPHATE 600 MG/50ML IV SOLN
600.0000 mg | Freq: Once | INTRAVENOUS | Status: AC
  Administered 2018-01-02: 600 mg via INTRAVENOUS
  Filled 2018-01-02: qty 50

## 2018-01-02 NOTE — ED Notes (Signed)
Pt verbalizes understanding of d/c instructions and denies any further needs at this time. 

## 2018-01-02 NOTE — ED Notes (Signed)
ED Provider at bedside. 

## 2018-01-02 NOTE — Discharge Instructions (Signed)
GO DIRECTLY TO [facility] OR [facility] IF YOU HAVE FEVER, WORSENING SYMPTOMS, OR BREATHING PROBLEMS.

## 2018-01-06 ENCOUNTER — Inpatient Hospital Stay (HOSPITAL_COMMUNITY)
Admission: AD | Admit: 2018-01-06 | Discharge: 2018-01-08 | DRG: 571 | Disposition: A | Discharge status: Home / Self Care | Payer: Managed Care, Other (non HMO) | Source: Ambulatory Visit | Attending: Surgery | Admitting: Surgery

## 2018-01-06 ENCOUNTER — Observation Stay (HOSPITAL_COMMUNITY): Payer: Managed Care, Other (non HMO) | Admitting: Anesthesiology

## 2018-01-06 ENCOUNTER — Encounter (HOSPITAL_COMMUNITY): Admission: AD | Disposition: A | Discharge status: Home / Self Care | Payer: Self-pay | Source: Ambulatory Visit

## 2018-01-06 ENCOUNTER — Other Ambulatory Visit: Payer: Self-pay

## 2018-01-06 ENCOUNTER — Encounter (HOSPITAL_COMMUNITY): Payer: Self-pay | Admitting: Anesthesiology

## 2018-01-06 DIAGNOSIS — E1165 Type 2 diabetes mellitus with hyperglycemia: Secondary | ICD-10-CM | POA: Diagnosis not present

## 2018-01-06 DIAGNOSIS — L723 Sebaceous cyst: Secondary | ICD-10-CM | POA: Diagnosis present

## 2018-01-06 DIAGNOSIS — Z6841 Body Mass Index (BMI) 40.0 and over, adult: Secondary | ICD-10-CM

## 2018-01-06 DIAGNOSIS — Z713 Dietary counseling and surveillance: Secondary | ICD-10-CM

## 2018-01-06 DIAGNOSIS — F329 Major depressive disorder, single episode, unspecified: Secondary | ICD-10-CM | POA: Diagnosis present

## 2018-01-06 DIAGNOSIS — K219 Gastro-esophageal reflux disease without esophagitis: Secondary | ICD-10-CM | POA: Diagnosis present

## 2018-01-06 DIAGNOSIS — IMO0001 Reserved for inherently not codable concepts without codable children: Secondary | ICD-10-CM | POA: Diagnosis present

## 2018-01-06 DIAGNOSIS — E1110 Type 2 diabetes mellitus with ketoacidosis without coma: Secondary | ICD-10-CM

## 2018-01-06 DIAGNOSIS — Z833 Family history of diabetes mellitus: Secondary | ICD-10-CM

## 2018-01-06 DIAGNOSIS — I10 Essential (primary) hypertension: Secondary | ICD-10-CM | POA: Diagnosis present

## 2018-01-06 DIAGNOSIS — L0211 Cutaneous abscess of neck: Principal | ICD-10-CM | POA: Diagnosis present

## 2018-01-06 HISTORY — DX: Depression, unspecified: F32.A

## 2018-01-06 HISTORY — DX: Headache, unspecified: R51.9

## 2018-01-06 HISTORY — PX: INCISION AND DRAINAGE ABSCESS: SHX5864

## 2018-01-06 HISTORY — DX: Gastro-esophageal reflux disease without esophagitis: K21.9

## 2018-01-06 HISTORY — DX: Major depressive disorder, single episode, unspecified: F32.9

## 2018-01-06 HISTORY — DX: Headache: R51

## 2018-01-06 LAB — CBC
HCT: 47.8 % (ref 39.0–52.0)
HEMATOCRIT: 48.6 % (ref 39.0–52.0)
HEMOGLOBIN: 16.6 g/dL (ref 13.0–17.0)
Hemoglobin: 16.5 g/dL (ref 13.0–17.0)
MCH: 29.8 pg (ref 26.0–34.0)
MCH: 30.1 pg (ref 26.0–34.0)
MCHC: 34.5 g/dL (ref 30.0–36.0)
MCHC: 34.2 g/dL (ref 30.0–36.0)
MCV: 86.3 fL (ref 78.0–100.0)
MCV: 88.2 fL (ref 78.0–100.0)
PLATELETS: 272 10*3/uL (ref 150–400)
Platelets: 231 10*3/uL (ref 150–400)
RBC: 5.54 MIL/uL (ref 4.22–5.81)
RBC: 5.51 MIL/uL (ref 4.22–5.81)
RDW: 12.9 % (ref 11.5–15.5)
RDW: 12.9 % (ref 11.5–15.5)
WBC: 8.4 10*3/uL (ref 4.0–10.5)
WBC: 10.9 10*3/uL — AB (ref 4.0–10.5)

## 2018-01-06 LAB — GLUCOSE, RANDOM: Glucose, Bld: 404 mg/dL — ABNORMAL HIGH (ref 65–99)

## 2018-01-06 LAB — CREATININE, SERUM
CREATININE: 1.06 mg/dL (ref 0.61–1.24)
GFR calc Af Amer: >60 mL/min (ref >60)

## 2018-01-06 LAB — GLUCOSE, CAPILLARY
GLUCOSE-CAPILLARY: 299 mg/dL — AB (ref 65–99)
GLUCOSE-CAPILLARY: 288 mg/dL — AB (ref 65–99)
GLUCOSE-CAPILLARY: 298 mg/dL — AB (ref 65–99)
Glucose-Capillary: 327 mg/dL — ABNORMAL HIGH (ref 65–99)
Glucose-Capillary: 407 mg/dL — ABNORMAL HIGH (ref 65–99)
Glucose-Capillary: 417 mg/dL — ABNORMAL HIGH (ref 65–99)

## 2018-01-06 LAB — HEMOGLOBIN A1C
Hgb A1c MFr Bld: 10.7 % — ABNORMAL HIGH (ref 4.8–5.6)
MEAN PLASMA GLUCOSE: 260.39 mg/dL

## 2018-01-06 SURGERY — INCISION AND DRAINAGE, ABSCESS
Anesthesia: General | Site: Neck

## 2018-01-06 MED ORDER — ONDANSETRON HCL 4 MG/2ML IJ SOLN
INTRAMUSCULAR | Status: DC | PRN
  Administered 2018-01-06: 4 mg via INTRAVENOUS

## 2018-01-06 MED ORDER — POLYETHYLENE GLYCOL 3350 17 G PO PACK: 17.0000 g | PACK | Freq: Every day | ORAL | Status: DC | PRN

## 2018-01-06 MED ORDER — PROPOFOL 10 MG/ML IV BOLUS
INTRAVENOUS | Status: AC
  Filled 2018-01-06: qty 20

## 2018-01-06 MED ORDER — SODIUM CHLORIDE 0.9 % IV SOLN: INTRAVENOUS | Status: DC

## 2018-01-06 MED ORDER — DOCUSATE SODIUM 100 MG PO CAPS
100.0000 mg | ORAL_CAPSULE | Freq: Two times a day (BID) | ORAL | Status: DC
  Administered 2018-01-06 – 2018-01-08 (×4): 100 mg via ORAL
  Filled 2018-01-06 (×4): qty 1

## 2018-01-06 MED ORDER — SUGAMMADEX SODIUM 500 MG/5ML IV SOLN
INTRAVENOUS | Status: DC | PRN
  Administered 2018-01-06: 400 mg via INTRAVENOUS

## 2018-01-06 MED ORDER — PROMETHAZINE HCL 25 MG/ML IJ SOLN: 6.2500 mg | INTRAMUSCULAR | Status: DC | PRN

## 2018-01-06 MED ORDER — FENTANYL CITRATE (PF) 100 MCG/2ML IJ SOLN: 25.0000 ug | INTRAMUSCULAR | Status: DC | PRN

## 2018-01-06 MED ORDER — ENOXAPARIN SODIUM 40 MG/0.4ML ~~LOC~~ SOLN
40.0000 mg | SUBCUTANEOUS | Status: DC
  Administered 2018-01-07 – 2018-01-08 (×2): 40 mg via SUBCUTANEOUS
  Filled 2018-01-06 (×2): qty 0.4

## 2018-01-06 MED ORDER — 0.9 % SODIUM CHLORIDE (POUR BTL) OPTIME
TOPICAL | Status: DC | PRN
  Administered 2018-01-06: 1000 mL

## 2018-01-06 MED ORDER — ONDANSETRON 4 MG PO TBDP: 4.0000 mg | ORAL_TABLET | Freq: Four times a day (QID) | ORAL | Status: DC | PRN

## 2018-01-06 MED ORDER — DIPHENHYDRAMINE HCL 50 MG/ML IJ SOLN: 25.0000 mg | Freq: Four times a day (QID) | INTRAMUSCULAR | Status: DC | PRN

## 2018-01-06 MED ORDER — SERTRALINE HCL 50 MG PO TABS
50.0000 mg | ORAL_TABLET | Freq: Every day | ORAL | Status: DC
  Administered 2018-01-07 – 2018-01-08 (×2): 50 mg via ORAL
  Filled 2018-01-06 (×2): qty 1

## 2018-01-06 MED ORDER — LIDOCAINE 2% (20 MG/ML) 5 ML SYRINGE
INTRAMUSCULAR | Status: AC
  Filled 2018-01-06: qty 5

## 2018-01-06 MED ORDER — ONDANSETRON HCL 4 MG/2ML IJ SOLN
INTRAMUSCULAR | Status: AC
  Filled 2018-01-06: qty 2

## 2018-01-06 MED ORDER — ACETAMINOPHEN 325 MG PO TABS
650.0000 mg | ORAL_TABLET | Freq: Four times a day (QID) | ORAL | Status: DC | PRN
  Administered 2018-01-08: 650 mg via ORAL
  Filled 2018-01-06: qty 2

## 2018-01-06 MED ORDER — METOPROLOL TARTRATE 5 MG/5ML IV SOLN: 5.0000 mg | Freq: Four times a day (QID) | INTRAVENOUS | Status: DC | PRN

## 2018-01-06 MED ORDER — LIDOCAINE 2% (20 MG/ML) 5 ML SYRINGE
INTRAMUSCULAR | Status: DC | PRN
  Administered 2018-01-06: 60 mg via INTRAVENOUS

## 2018-01-06 MED ORDER — ROCURONIUM BROMIDE 10 MG/ML (PF) SYRINGE
PREFILLED_SYRINGE | INTRAVENOUS | Status: AC
  Filled 2018-01-06: qty 5

## 2018-01-06 MED ORDER — DIPHENHYDRAMINE HCL 25 MG PO CAPS: 25.0000 mg | ORAL_CAPSULE | Freq: Four times a day (QID) | ORAL | Status: DC | PRN

## 2018-01-06 MED ORDER — PIPERACILLIN-TAZOBACTAM 3.375 G IVPB
3.3750 g | Freq: Three times a day (TID) | INTRAVENOUS | Status: DC
  Administered 2018-01-06 – 2018-01-08 (×6): 3.375 g via INTRAVENOUS
  Filled 2018-01-06 (×8): qty 50

## 2018-01-06 MED ORDER — MORPHINE SULFATE (PF) 4 MG/ML IV SOLN: 2.0000 mg | INTRAVENOUS | Status: DC | PRN

## 2018-01-06 MED ORDER — POTASSIUM CHLORIDE IN NACL 20-0.45 MEQ/L-% IV SOLN
INTRAVENOUS | Status: DC
  Administered 2018-01-06: 19:00:00 via INTRAVENOUS
  Filled 2018-01-06: qty 1000

## 2018-01-06 MED ORDER — INSULIN ASPART 100 UNIT/ML ~~LOC~~ SOLN: 0.0000 [IU] | Freq: Every day | SUBCUTANEOUS | Status: DC

## 2018-01-06 MED ORDER — PANTOPRAZOLE SODIUM 40 MG IV SOLR
40.0000 mg | Freq: Every day | INTRAVENOUS | Status: DC
  Administered 2018-01-06: 40 mg via INTRAVENOUS
  Filled 2018-01-06: qty 40

## 2018-01-06 MED ORDER — ONDANSETRON HCL 4 MG/2ML IJ SOLN: 4.0000 mg | Freq: Four times a day (QID) | INTRAMUSCULAR | Status: DC | PRN

## 2018-01-06 MED ORDER — INSULIN ASPART 100 UNIT/ML ~~LOC~~ SOLN
0.0000 [IU] | Freq: Three times a day (TID) | SUBCUTANEOUS | Status: DC
  Administered 2018-01-06: 11 [IU] via SUBCUTANEOUS
  Administered 2018-01-06: 8 [IU] via SUBCUTANEOUS

## 2018-01-06 MED ORDER — OXYCODONE HCL 5 MG/5ML PO SOLN: 5.0000 mg | Freq: Once | ORAL | Status: DC | PRN

## 2018-01-06 MED ORDER — FUROSEMIDE 20 MG PO TABS
20.0000 mg | ORAL_TABLET | Freq: Two times a day (BID) | ORAL | Status: DC
  Administered 2018-01-07 – 2018-01-08 (×3): 20 mg via ORAL
  Filled 2018-01-06 (×3): qty 1

## 2018-01-06 MED ORDER — OXYCODONE HCL 5 MG PO TABS: 5.0000 mg | ORAL_TABLET | Freq: Once | ORAL | Status: DC | PRN

## 2018-01-06 MED ORDER — FENTANYL CITRATE (PF) 100 MCG/2ML IJ SOLN
INTRAMUSCULAR | Status: DC | PRN
  Administered 2018-01-06: 50 ug via INTRAVENOUS

## 2018-01-06 MED ORDER — BUPIVACAINE-EPINEPHRINE (PF) 0.25% -1:200000 IJ SOLN
INTRAMUSCULAR | Status: DC | PRN
  Administered 2018-01-06: 20 mL

## 2018-01-06 MED ORDER — FENTANYL CITRATE (PF) 250 MCG/5ML IJ SOLN
INTRAMUSCULAR | Status: AC
  Filled 2018-01-06: qty 5

## 2018-01-06 MED ORDER — INSULIN ASPART 100 UNIT/ML ~~LOC~~ SOLN
0.0000 [IU] | Freq: Three times a day (TID) | SUBCUTANEOUS | Status: DC
  Administered 2018-01-07: 15 [IU] via SUBCUTANEOUS
  Administered 2018-01-07: 20 [IU] via SUBCUTANEOUS
  Administered 2018-01-07 (×2): 15 [IU] via SUBCUTANEOUS
  Administered 2018-01-08 (×2): 7 [IU] via SUBCUTANEOUS

## 2018-01-06 MED ORDER — MEPERIDINE HCL 25 MG/ML IJ SOLN: 6.2500 mg | INTRAMUSCULAR | Status: DC | PRN

## 2018-01-06 MED ORDER — LACTATED RINGERS IV SOLN
INTRAVENOUS | Status: DC
  Administered 2018-01-06: 14:00:00 via INTRAVENOUS

## 2018-01-06 MED ORDER — SUCCINYLCHOLINE CHLORIDE 200 MG/10ML IV SOSY
PREFILLED_SYRINGE | INTRAVENOUS | Status: DC | PRN
  Administered 2018-01-06: 160 mg via INTRAVENOUS

## 2018-01-06 MED ORDER — FENTANYL CITRATE (PF) 100 MCG/2ML IJ SOLN
25.0000 ug | INTRAMUSCULAR | Status: DC | PRN
  Administered 2018-01-06 (×4): 25 ug via INTRAVENOUS

## 2018-01-06 MED ORDER — HYDROMORPHONE HCL 1 MG/ML IJ SOLN: 0.5000 mg | INTRAMUSCULAR | Status: DC | PRN

## 2018-01-06 MED ORDER — ONDANSETRON HCL 4 MG/2ML IJ SOLN: 4.0000 mg | Freq: Once | INTRAMUSCULAR | Status: DC | PRN

## 2018-01-06 MED ORDER — PROPOFOL 10 MG/ML IV BOLUS
INTRAVENOUS | Status: DC | PRN
  Administered 2018-01-06: 200 mg via INTRAVENOUS

## 2018-01-06 MED ORDER — ACETAMINOPHEN 650 MG RE SUPP: 650.0000 mg | Freq: Four times a day (QID) | RECTAL | Status: DC | PRN

## 2018-01-06 MED ORDER — MIDAZOLAM HCL 2 MG/2ML IJ SOLN
INTRAMUSCULAR | Status: AC
  Filled 2018-01-06: qty 2

## 2018-01-06 MED ORDER — MIDAZOLAM HCL 5 MG/5ML IJ SOLN
INTRAMUSCULAR | Status: DC | PRN
  Administered 2018-01-06: 2 mg via INTRAVENOUS

## 2018-01-06 MED ORDER — IBUPROFEN 600 MG PO TABS: 600.0000 mg | ORAL_TABLET | Freq: Four times a day (QID) | ORAL | Status: DC | PRN

## 2018-01-06 MED ORDER — SUCCINYLCHOLINE CHLORIDE 200 MG/10ML IV SOSY
PREFILLED_SYRINGE | INTRAVENOUS | Status: AC
  Filled 2018-01-06: qty 10

## 2018-01-06 MED ORDER — OXYCODONE HCL 5 MG PO TABS
5.0000 mg | ORAL_TABLET | ORAL | Status: DC | PRN
  Administered 2018-01-06 – 2018-01-07 (×6): 10 mg via ORAL
  Filled 2018-01-06 (×7): qty 2

## 2018-01-06 MED ORDER — ROCURONIUM BROMIDE 10 MG/ML (PF) SYRINGE
PREFILLED_SYRINGE | INTRAVENOUS | Status: DC | PRN
  Administered 2018-01-06: 40 mg via INTRAVENOUS

## 2018-01-06 MED ORDER — BUPIVACAINE-EPINEPHRINE (PF) 0.25% -1:200000 IJ SOLN
INTRAMUSCULAR | Status: AC
  Filled 2018-01-06: qty 30

## 2018-01-06 MED ORDER — BUPROPION HCL ER (XL) 150 MG PO TB24
300.0000 mg | ORAL_TABLET | Freq: Every day | ORAL | Status: DC
  Administered 2018-01-07 – 2018-01-08 (×2): 300 mg via ORAL
  Filled 2018-01-06 (×2): qty 2

## 2018-01-06 MED ORDER — INSULIN ASPART 100 UNIT/ML ~~LOC~~ SOLN: 0.0000 [IU] | Freq: Three times a day (TID) | SUBCUTANEOUS | Status: DC

## 2018-01-06 MED ORDER — INSULIN ASPART 100 UNIT/ML ~~LOC~~ SOLN
SUBCUTANEOUS | Status: AC
  Filled 2018-01-06: qty 1

## 2018-01-06 MED ORDER — DEXAMETHASONE SODIUM PHOSPHATE 10 MG/ML IJ SOLN
INTRAMUSCULAR | Status: DC | PRN
  Administered 2018-01-06: 5 mg via INTRAVENOUS

## 2018-01-06 MED ORDER — FENTANYL CITRATE (PF) 100 MCG/2ML IJ SOLN
INTRAMUSCULAR | Status: AC
  Filled 2018-01-06: qty 2

## 2018-01-06 MED ORDER — LACTATED RINGERS IV SOLN: INTRAVENOUS | Status: DC

## 2018-01-06 MED ORDER — INSULIN ASPART 100 UNIT/ML ~~LOC~~ SOLN
0.0000 [IU] | Freq: Every day | SUBCUTANEOUS | Status: DC
  Administered 2018-01-07: 4 [IU] via SUBCUTANEOUS

## 2018-01-06 MED ORDER — INSULIN ASPART 100 UNIT/ML ~~LOC~~ SOLN
0.0000 [IU] | Freq: Every day | SUBCUTANEOUS | Status: DC
  Administered 2018-01-06: 5 [IU] via SUBCUTANEOUS

## 2018-01-06 SURGICAL SUPPLY — 28 items
BLADE CLIPPER SURG (BLADE) IMPLANT
BNDG GAUZE ELAST 4 BULKY (GAUZE/BANDAGES/DRESSINGS) IMPLANT
CANISTER SUCT 3000ML PPV (MISCELLANEOUS) ×3 IMPLANT
CHLORAPREP W/TINT 26ML (MISCELLANEOUS) IMPLANT
COVER SURGICAL LIGHT HANDLE (MISCELLANEOUS) ×3 IMPLANT
DRAPE LAPAROSCOPIC ABDOMINAL (DRAPES) IMPLANT
DRAPE LAPAROTOMY 100X72 PEDS (DRAPES) IMPLANT
DRSG PAD ABDOMINAL 8X10 ST (GAUZE/BANDAGES/DRESSINGS) IMPLANT
ELECT CAUTERY BLADE 6.4 (BLADE) ×3 IMPLANT
ELECT REM PT RETURN 9FT ADLT (ELECTROSURGICAL) ×3
ELECTRODE REM PT RTRN 9FT ADLT (ELECTROSURGICAL) ×1 IMPLANT
GAUZE SPONGE 4X4 12PLY STRL (GAUZE/BANDAGES/DRESSINGS) IMPLANT
GAUZE SPONGE 4X4 12PLY STRL LF (GAUZE/BANDAGES/DRESSINGS) ×3 IMPLANT
GLOVE BIO SURGEON STRL SZ7 (GLOVE) ×3 IMPLANT
GLOVE BIOGEL PI IND STRL 7.5 (GLOVE) ×1 IMPLANT
GLOVE BIOGEL PI INDICATOR 7.5 (GLOVE) ×2
GOWN STRL REUS W/ TWL LRG LVL3 (GOWN DISPOSABLE) ×2 IMPLANT
GOWN STRL REUS W/TWL LRG LVL3 (GOWN DISPOSABLE) ×4
KIT BASIN OR (CUSTOM PROCEDURE TRAY) ×3 IMPLANT
KIT ROOM TURNOVER OR (KITS) ×3 IMPLANT
NS IRRIG 1000ML POUR BTL (IV SOLUTION) ×3 IMPLANT
PACK GENERAL/GYN (CUSTOM PROCEDURE TRAY) ×3 IMPLANT
PAD ABD 8X10 STRL (GAUZE/BANDAGES/DRESSINGS) ×3 IMPLANT
PAD ARMBOARD 7.5X6 YLW CONV (MISCELLANEOUS) ×3 IMPLANT
SWAB COLLECTION DEVICE MRSA (MISCELLANEOUS) IMPLANT
SWAB CULTURE ESWAB REG 1ML (MISCELLANEOUS) IMPLANT
TOWEL OR 17X24 6PK STRL BLUE (TOWEL DISPOSABLE) ×3 IMPLANT
TOWEL OR 17X26 10 PK STRL BLUE (TOWEL DISPOSABLE) ×3 IMPLANT

## 2018-01-06 NOTE — Anesthesia Procedure Notes (Signed)
Procedure Name: Intubation Date/Time: 01/06/2018 2:14 PM Performed by: Tillman AbideHawkins, Takelia Urieta B, CRNA Pre-anesthesia Checklist: Patient identified, Emergency Drugs available, Suction available and Patient being monitored Patient Re-evaluated:Patient Re-evaluated prior to induction Oxygen Delivery Method: Circle System Utilized Preoxygenation: Pre-oxygenation with 100% oxygen Induction Type: IV induction Laryngoscope Size: Glidescope and 4 Grade View: Grade I Tube type: Oral Tube size: 7.5 mm Number of attempts: 1 Airway Equipment and Method: Stylet and Video-laryngoscopy Placement Confirmation: ETT inserted through vocal cords under direct vision,  positive ETCO2 and breath sounds checked- equal and bilateral Secured at: 23 cm Tube secured with: Tape Dental Injury: Teeth and Oropharynx as per pre-operative assessment

## 2018-01-06 NOTE — H&P (Signed)
Ernest Franco Documented: 01/06/2018 10:47 AM Location: [facility] Patient #: [id number] DOB: November 18, 1971 Married / Language: English / Race: White Male   History of Present Illness  The patient is a 47 year old male who presents with a subcutaneous abscess. He presented to [facility] on 01/01/18 with complaints of posterior neck pain x 10 days. He originally felt a small lump on the back of his neck when he was in [employer], but did not seek medical attention until it came to the point where he was unable to move his neck. Due to the depth of the abscess, a CT scan was ordered to better evaluate the area. It showed a 2.2 x 1.9 x 1.8 cm abscess left of posterior midline that was about 2.2 cm deep to the skin. Dr. Donell Beers was consulted that night and she recommended that he go to [facility] to have he area drained, but he refused. As an alternative, she offered oral antibiotics since he was stable and follow-up the next day in the office.   When I saw him on 01/02/18, I aspirated about 5 cc with an 18G needle, and made an incision to help drain the cavity. Culture results as follows:  GRAM STAIN: Moderate Polymorphonuclear leukocytes No epithelial cells seen Rare Gram positive cocci in pairs  RESULT: Light growth of Enterococcus species  Enterococcus sp. ----------------    INT MIC AMPICILLIN S <=2 VANCOMYCIN S 2  He is returning to clinic today with complaints of increased pain and minimal improvement in ROM of neck. He denies fever/chills but states he "gets sick and almost passes out" when he the area is touched due to the pain. His wife states the area is still draining when pressure is applied but can not drain fully. He has been taking Clindamycin for 4 days. He states he has "borderline diabetes" - his blood glucose was 375 last  week at the hospital. He is not on any blood thinners and has not eaten anything today.   Allergies NKDA Allergies Reconciled    Medication History Sertraline HCl (50MG  Tablet, Oral) Active. Furosemide (20MG  Tablet, Oral) Active. BuPROPion HCl ER (XL) (300MG  Tablet ER 24HR, Oral) Active. Aspirin (325MG  Tablet, Oral) Active. Medications Reconciled   Review of Systems  General Not Present- Appetite Loss, Chills, Fatigue, Fever, Night Sweats, Weight Gain and Weight Loss. Skin Present- New Lesions. Not Present- Change in Wart/Mole, Dryness, Hives, Jaundice, Non-Healing Wounds, Rash and Ulcer. HEENT Not Present- Earache, Hearing Loss, Hoarseness, Nose Bleed, Oral Ulcers, Ringing in the Ears, Seasonal Allergies, Sinus Pain, Sore Throat, Visual Disturbances, Wears glasses/contact lenses and Yellow Eyes. Respiratory Not Present- Bloody sputum, Chronic Cough, Difficulty Breathing, Snoring and Wheezing. Breast Not Present- Breast Mass, Breast Pain, Nipple Discharge and Skin Changes. Cardiovascular Not Present- Chest Pain, Difficulty Breathing Lying Down, Leg Cramps, Palpitations, Rapid Heart Rate, Shortness of Breath and Swelling of Extremities. Gastrointestinal Not Present- Abdominal Pain, Bloating, Bloody Stool, Change in Bowel Habits, Chronic diarrhea, Constipation, Difficulty Swallowing, Excessive gas, Gets full quickly at meals, Hemorrhoids, Indigestion, Nausea, Rectal Pain and Vomiting. Male Genitourinary Not Present- Blood in Urine, Change in Urinary Stream, Frequency, Impotence, Nocturia, Painful Urination, Urgency and Urine Leakage. Musculoskeletal Not Present- Back Pain, Joint Pain, Joint Stiffness, Muscle Pain, Muscle Weakness and Swelling of Extremities. Neurological Not Present- Decreased Memory, Fainting, Headaches, Numbness, Seizures, Tingling, Tremor, Trouble walking and Weakness. Psychiatric Not Present- Anxiety, Bipolar, Change in Sleep Pattern, Depression, Fearful and  Frequent crying. Hematology Not Present-  Blood Thinners, Easy Bruising, Excessive bleeding, Gland problems, HIV and Persistent Infections.   Vitals  01/06/2018 10:54 AM Weight: 392.6 lb Height: 67in Body Surface Area: 2.69 m Body Mass Index: 61.49 kg/m  Temp.: 97.59F  Pulse: 84 (Regular)  BP: 129/89 (Sitting, Left Arm, Standard)   Physical Exam  GENERAL: Obese male in no acute distress  EYES: No scleral icterus Pupils equal, lids normal  EXTERNAL EARS: Intact, no masses or lesions EXTERNAL NOSE: Intact, no masses or lesions MOUTH: Lips - no lesions Dentition - normal for age  RESPIRATORY: Normal effort, no use of accessory muscles  MUSCULOSKELETAL: Normal gait Grossly normal ROM upper extremities Grossly normal ROM lower extremities Decreased ROM of neck  SKIN: Warm and dry Not diaphoretic  PSYCHIATRIC: Normal judgement and insight Normal mood and affect Alert, oriented x 3  Head and Neck Note: Posterior neck, left of midline: Previous area of erythema/induration which was about 6 cm wide has improved greatly - very minimal induration and almost no erythema Very TTP, will not allow me to probe the area with cotton-tipped applicator Still draining thick, purulent material from a 1 cm incision   Assessment & Plan  POSTERIOR NECK ABSCESS Impression: He presented with a 10 day history of worsening neck pain due to a deep posterior neck abscess. Four days ago, I aspirated a small amount fluid and then made an incision to release a moderate amount of purulent material and packed the incision. Despite improved erythema and induration, he is now having worsening pain. The area is still draining, but he believes it is not adequately draining. I discussed his care with Dr. Donell BeersByerly, who recommends possible I&D in the OR due to its depth and his symptoms. The patient agrees with this plan and the DOW physician was notified. He was instructed to stay NPO and  provided verbal understanding. He will be [facility] to [facility] once a bed becomes available.  Signed electronically by Tsosie BillingPuja G Demoni Parmar, PA C (01/06/2018 12:01 PM)

## 2018-01-06 NOTE — Transfer of Care (Signed)
Immediate Anesthesia Transfer of Care Note  Patient: Ernest RosenthalJerry Batta  Procedure(s) Performed: INCISION AND DRAINAGE OF POSTERIOR NECK ABSCESS (N/A Neck)  Patient Location: PACU  Anesthesia Type:General  Level of Consciousness: awake, alert , oriented and patient cooperative  Airway & Oxygen Therapy: Patient Spontanous Breathing and Patient connected to nasal cannula oxygen  Post-op Assessment: Report given to RN and Post -op Vital signs reviewed and stable  Post vital signs: Reviewed and stable  Last Vitals:  Vitals:   01/06/18 1314 01/06/18 1400  BP:  135/82  Pulse: 87 85  Resp: 20 20  Temp: 36.7 C 36.8 C  SpO2: 98% 97%    Last Pain:  Vitals:   01/06/18 1400  TempSrc: Oral  PainSc:       Patients Stated Pain Goal: 0 (01/06/18 1348)  Complications: No apparent anesthesia complications

## 2018-01-06 NOTE — Op Note (Signed)
Preop diagnosis: Deep soft tissue abscess posterior neck Postop diagnosis: Same Procedure performed: Incision and drainage of deep soft tissue posterior neck abscess Surgeon: Manus RuddMatthew Ismeal Heider MD Anesthesia: General endotracheal Indications: This is a morbidly obese 47 year old male who presents with a one-week history of worsening pain and swelling on his posterior neck.  He was seen at [facility] on 01/01/17 and a CT scan showed a 2.2 cm abscess of the posterior neck that was 2.2 cm deep to the skin.  The patient refused hospital transfer that night.  He was seen in the office and an attempt was made at a local incision and drainage.  However he was unable to tolerate a deep debridement.  Antibiotics were unsuccessful in clearing up the infection.  The pain is worsening so he presented back to the office today.  He is being directly admitted for surgery.  Description of procedure: The patient is brought to the operating room and placed in supine position on the bed.  After an adequate level of general anesthesia was obtained, he was moved to a prone position on the table.  This is very difficult due to his morbid obesity.  We had difficulty in positioning him as he has a lot of fat on the back of his neck and the base of the skull.  We were finally able to prepped this area with Betadine.  I made a 3 cm round incision around his previous incision.  We dissected down through a considerable amount of skin and subcutaneous tissue into the fat.  We encountered what appeared to be a ruptured sebaceous cyst.  We answered a deep cavity of purulent fluid measuring about 3 cm across.  We debrided the size of this abscess cavity.  Hemostasis was obtained with cautery.  We irrigated the wound thoroughly.  The wound was packed with saline moistened gauze.  We infiltrated 0.25% Marcaine with epinephrine around the wound.  A dry dressing was applied.  The patient was then moved back to a supine position on the bed  and was extubated.  He is brought to the recovery room in stable condition.  All sponge, instrument, needle counts are correct.  Wilmon ArmsMatthew K. Corliss Skainssuei, MD, [facility]  General/ Trauma Surgery  01/06/2018 2:58 PM

## 2018-01-06 NOTE — Anesthesia Preprocedure Evaluation (Addendum)
Anesthesia Evaluation  Patient identified by MRN, date of birth, ID band Patient awake    Reviewed: Allergy & Precautions, NPO status , Patient's Chart, lab work & pertinent test results  Airway Mallampati: II  TM Distance: >3 FB Neck ROM: Full    Dental  (+) Teeth Intact, Chipped,    Pulmonary neg pulmonary ROS,    breath sounds clear to auscultation       Cardiovascular hypertension,  Rhythm:Regular Rate:Normal     Neuro/Psych  Headaches, PSYCHIATRIC DISORDERS Depression TIACVA negative psych ROS   GI/Hepatic Neg liver ROS, GERD  ,  Endo/Other  diabetes, Type 2, Oral Hypoglycemic Agents  Renal/GU negative Renal ROS     Musculoskeletal negative musculoskeletal ROS (+)   Abdominal (+) + obese,   Peds  Hematology negative hematology ROS (+)   Anesthesia Other Findings   Reproductive/Obstetrics                           Anesthesia Physical Anesthesia Plan  ASA: IV  Anesthesia Plan: General   Post-op Pain Management:    Induction: Intravenous  PONV Risk Score and Plan: 3 and Dexamethasone, Ondansetron and Midazolam  Airway Management Planned: Oral ETT  Additional Equipment: None  Intra-op Plan:   Post-operative Plan: Extubation in OR  Informed Consent: I have reviewed the patients History and Physical, chart, labs and discussed the procedure including the risks, benefits and alternatives for the proposed anesthesia with the patient or authorized representative who has indicated his/her understanding and acceptance.   Dental advisory given  Plan Discussed with: CRNA  Anesthesia Plan Comments:        Anesthesia Quick Evaluation

## 2018-01-07 ENCOUNTER — Encounter (HOSPITAL_COMMUNITY): Payer: Self-pay | Admitting: Surgery

## 2018-01-07 DIAGNOSIS — E1165 Type 2 diabetes mellitus with hyperglycemia: Secondary | ICD-10-CM | POA: Diagnosis not present

## 2018-01-07 DIAGNOSIS — I10 Essential (primary) hypertension: Secondary | ICD-10-CM | POA: Diagnosis present

## 2018-01-07 DIAGNOSIS — L0211 Cutaneous abscess of neck: Secondary | ICD-10-CM | POA: Diagnosis present

## 2018-01-07 DIAGNOSIS — F329 Major depressive disorder, single episode, unspecified: Secondary | ICD-10-CM | POA: Diagnosis present

## 2018-01-07 DIAGNOSIS — K219 Gastro-esophageal reflux disease without esophagitis: Secondary | ICD-10-CM | POA: Diagnosis present

## 2018-01-07 DIAGNOSIS — Z6841 Body Mass Index (BMI) 40.0 and over, adult: Secondary | ICD-10-CM | POA: Diagnosis not present

## 2018-01-07 DIAGNOSIS — Z713 Dietary counseling and surveillance: Secondary | ICD-10-CM | POA: Diagnosis not present

## 2018-01-07 DIAGNOSIS — Z833 Family history of diabetes mellitus: Secondary | ICD-10-CM | POA: Diagnosis not present

## 2018-01-07 DIAGNOSIS — L723 Sebaceous cyst: Secondary | ICD-10-CM | POA: Diagnosis present

## 2018-01-07 LAB — GLUCOSE, CAPILLARY
GLUCOSE-CAPILLARY: 319 mg/dL — AB (ref 65–99)
GLUCOSE-CAPILLARY: 313 mg/dL — AB (ref 65–99)
Glucose-Capillary: 332 mg/dL — ABNORMAL HIGH (ref 65–99)
Glucose-Capillary: 377 mg/dL — ABNORMAL HIGH (ref 65–99)
Glucose-Capillary: 413 mg/dL — ABNORMAL HIGH (ref 65–99)

## 2018-01-07 LAB — BASIC METABOLIC PANEL
ANION GAP: 11 (ref 5–15)
BUN: 17 mg/dL (ref 6–20)
CALCIUM: 8.7 mg/dL — AB (ref 8.9–10.3)
CO2: 24 mmol/L (ref 22–32)
Chloride: 97 mmol/L — ABNORMAL LOW (ref 101–111)
Creatinine, Ser: 1.19 mg/dL (ref 0.61–1.24)
GFR calc Af Amer: >60 mL/min (ref >60)
Glucose, Bld: 372 mg/dL — ABNORMAL HIGH (ref 65–99)
POTASSIUM: 4.8 mmol/L (ref 3.5–5.1)
SODIUM: 132 mmol/L — AB (ref 135–145)

## 2018-01-07 LAB — CBC
HCT: 46.2 % (ref 39.0–52.0)
Hemoglobin: 15.2 g/dL (ref 13.0–17.0)
MCH: 28.9 pg (ref 26.0–34.0)
MCHC: 32.9 g/dL (ref 30.0–36.0)
MCV: 87.8 fL (ref 78.0–100.0)
PLATELETS: 238 10*3/uL (ref 150–400)
RBC: 5.26 MIL/uL (ref 4.22–5.81)
RDW: 12.7 % (ref 11.5–15.5)
WBC: 14.4 10*3/uL — AB (ref 4.0–10.5)

## 2018-01-07 LAB — HIV ANTIBODY (ROUTINE TESTING W REFLEX): HIV Screen 4th Generation wRfx: NONREACTIVE

## 2018-01-07 MED ORDER — INSULIN GLARGINE 100 UNIT/ML ~~LOC~~ SOLN
15.0000 [IU] | Freq: Every day | SUBCUTANEOUS | Status: DC
  Administered 2018-01-07: 15 [IU] via SUBCUTANEOUS
  Filled 2018-01-07 (×2): qty 0.15

## 2018-01-07 MED ORDER — GLIPIZIDE 5 MG PO TABS
5.0000 mg | ORAL_TABLET | Freq: Two times a day (BID) | ORAL | Status: DC
  Administered 2018-01-07 – 2018-01-08 (×2): 5 mg via ORAL
  Filled 2018-01-07 (×2): qty 1

## 2018-01-07 MED ORDER — PANTOPRAZOLE SODIUM 40 MG PO TBEC
40.0000 mg | DELAYED_RELEASE_TABLET | Freq: Every day | ORAL | Status: DC
  Administered 2018-01-07: 40 mg via ORAL
  Filled 2018-01-07: qty 1

## 2018-01-07 MED ORDER — LIVING WELL WITH DIABETES BOOK
Freq: Once | Status: AC
  Administered 2018-01-07: 17:00:00
  Filled 2018-01-07: qty 1

## 2018-01-07 NOTE — Progress Notes (Signed)
Patient's CBG was 407 @ 2149 and 417 @ 2151. Stat glucose verification ordered and MD Wilson notified via text page. Glucose verification resulted as 404. Notified MD Andrey CampanileWilson via text page. Wilson returned call; gave verbal order to change patient's current sliding scale to resistant sliding scale. Patient already received 5units Novolog per HS sliding scale. Andrey CampanileWilson stated to give the difference in insulin per resistant sliding scale. According to resistant sliding scale, patient requires 20 units of insulin; pt already received 5units insulin, therefore will give 15units insulin.

## 2018-01-07 NOTE — Progress Notes (Signed)
Blood sugar 419, Dr Gwinda Passesuie notified.

## 2018-01-07 NOTE — Progress Notes (Signed)
  [facility] Progress Note  1 Day Post-Op  Subjective: CC- neck pain Wife at bedside. Patient states that his neck is very sore, oxycodone does help. Denies n/v. Tolerating diet.  Ready for first dressing change.  Objective: Vital signs in last 24 hours: Temp:  [97.2 F (36.2 C)-98.3 F (36.8 C)] 98.1 F (36.7 C) (01/08 0436) Pulse Rate:  [70-97] 85 (01/08 0436) Resp:  [8-20] 18 (01/08 0436) BP: (109-153)/(56-91) 109/56 (01/08 0436) SpO2:  [93 %-100 %] 95 % (01/08 0436) Weight:  [385 lb (174.6 kg)] 385 lb (174.6 kg) (01/07 1400) Last BM Date: 01/06/18  Intake/Output from previous day: 01/07 0701 - 01/08 0700 In: 1620.8 [P.O.:480; I.V.:1040.8; IV Piggyback:100] Out: 10 [Blood:10] Intake/Output this shift: No intake/output data recorded.  PE: Gen:  Alert, NAD, pleasant HEENT: EOM's intact, pupils equal and round Pulm:  effort normal Psych: A&Ox3 Skin: posterior neck abscess s/p I&D about 3x3x3cm with no bloody or purulent drainage  Lab Results:  Recent Labs    01/06/18 1355 01/06/18 1802  WBC 8.4 10.9*  HGB 16.5 16.6  HCT 47.8 48.6  PLT 272 231   BMET Recent Labs    01/06/18 1802 01/06/18 2218  GLUCOSE  --  404*  CREATININE 1.06  --    PT/INR No results for input(s): LABPROT, INR in the last 72 hours. CMP     Component Value Date/Time   NA 135 01/01/2018 2159   K 4.2 01/01/2018 2159   CL 100 (L) 01/01/2018 2159   CO2 27 01/01/2018 2159   GLUCOSE 404 (H) 01/06/2018 2218   BUN 10 01/01/2018 2159   CREATININE 1.06 01/06/2018 1802   CALCIUM 9.2 01/01/2018 2159   PROT 7.8 01/01/2018 2159   ALBUMIN 3.4 (L) 01/01/2018 2159   AST 36 01/01/2018 2159   ALT 47 01/01/2018 2159   ALKPHOS 89 01/01/2018 2159   BILITOT 0.5 01/01/2018 2159   GFRNONAA >60 01/06/2018 1802   GFRAA >60 01/06/2018 1802   Lipase  No results found for: LIPASE     Studies/Results: No results found.  Anti-infectives: Anti-infectives (From admission, onward)   Start     Dose/Rate Route Frequency Ordered Stop   01/06/18 1800  piperacillin-tazobactam (ZOSYN) IVPB 3.375 g     3.375 g 12.5 mL/hr over 240 Minutes Intravenous Every 8 hours 01/06/18 1711         Assessment/Plan DM - SSI. Diabetes coordinator eval pending Morbid obesity GERD HTN - home meds Depression - home meds  Deep soft tissue abscess posterior neck S/p Incision and drainage of deep soft tissue posterior neck abscess 1/7 Dr. Corliss Skainssuei - POD 1 - culture pending - BID wet to dry dressing changes  ID - zosyn 1/7>>day#2 FEN - carb modified diet VTE - lovenox, SCDs Foley - none Follow up - [facility] clinic 1-2 weeks  Plan - Labs pending. First dressing change performed, continue wet to dry dressing changes BID. Ok to shower with wound open. Continue IV zosyn. Follow culture.  Diabetes coordinator evaluation pending. Patient with persistent hyperglycemia; if this improves we may be able to discharge him this afternoon.   LOS: 1 day    Franne FortsBrooke A Meuth , [facility] 01/07/2018, 7:58 AM Pager: [phone] Consults: [phone] Mon-Fri 7:00 am-4:30 pm Sat-Sun 7:00 am-11:30 am

## 2018-01-07 NOTE — Discharge Instructions (Addendum)
WOUND CARE: - dressing to be changed twice daily - supplies: sterile saline, gauze, scissors, ABD pads, tape  - remove dressing and all packing carefully, moistening with sterile saline as needed to avoid packing/internal dressing sticking to the wound. - clean edges of skin around the wound with water/gauze, making sure there is no tape debris or leakage left on skin that could cause skin irritation or breakdown. - dampen and clean gauze with sterile saline and pack wound from wound base to skin level, making sure to take note of any possible areas of wound tracking, tunneling and packing appropriately. Wound can be packed loosely. Trim kerlix to size if a whole kerlix is not required. - cover wound with a dry ABD pad and secure with tape.  - change dressing as needed if leakage occurs, wound gets contaminated, or patient requests to shower. - patient may shower daily with wound open and following the shower the wound should be dried and a clean dressing placed.     Diabetes Mellitus and Nutrition When you have diabetes (diabetes mellitus), it is very important to have healthy eating habits because your blood sugar (glucose) levels are greatly affected by what you eat and drink. Eating healthy foods in the appropriate amounts, at about the same times every day, can help you:  Control your blood glucose.  Lower your risk of heart disease.  Improve your blood pressure.  Reach or maintain a healthy weight.  Every person with diabetes is different, and each person has different needs for a meal plan. Your health care provider may recommend that you work with a diet and nutrition specialist (dietitian) to make a meal plan that is best for you. Your meal plan may vary depending on factors such as:  The calories you need.  The medicines you take.  Your weight.  Your blood glucose, blood pressure, and cholesterol levels.  Your activity level.  Other health conditions you have, such as  heart or kidney disease.  How do carbohydrates affect me? Carbohydrates affect your blood glucose level more than any other type of food. Eating carbohydrates naturally increases the amount of glucose in your blood. Carbohydrate counting is a method for keeping track of how many carbohydrates you eat. Counting carbohydrates is important to keep your blood glucose at a healthy level, especially if you use insulin or take certain oral diabetes medicines. It is important to know how many carbohydrates you can safely have in each meal. This is different for every person. Your dietitian can help you calculate how many carbohydrates you should have at each meal and for snack. Foods that contain carbohydrates include:  Bread, cereal, rice, pasta, and crackers.  Potatoes and corn.  Peas, beans, and lentils.  Milk and yogurt.  Fruit and juice.  Desserts, such as cakes, cookies, ice cream, and candy.  How does alcohol affect me? Alcohol can cause a sudden decrease in blood glucose (hypoglycemia), especially if you use insulin or take certain oral diabetes medicines. Hypoglycemia can be a life-threatening condition. Symptoms of hypoglycemia (sleepiness, dizziness, and confusion) are similar to symptoms of having too much alcohol. If your health care provider says that alcohol is safe for you, follow these guidelines:  Limit alcohol intake to no more than 1 drink per day for nonpregnant women and 2 drinks per day for men. One drink equals 12 oz of beer, 5 oz of wine, or 1 oz of hard liquor.  Do not drink on an empty stomach.  Keep yourself  hydrated with water, diet soda, or unsweetened iced tea.  Keep in mind that regular soda, juice, and other mixers may contain a lot of sugar and must be counted as carbohydrates.  What are tips for following this plan? Reading food labels  Start by checking the serving size on the label. The amount of calories, carbohydrates, fats, and other nutrients listed  on the label are based on one serving of the food. Many foods contain more than one serving per package.  Check the total grams (g) of carbohydrates in one serving. You can calculate the number of servings of carbohydrates in one serving by dividing the total carbohydrates by 15. For example, if a food has 30 g of total carbohydrates, it would be equal to 2 servings of carbohydrates.  Check the number of grams (g) of saturated and trans fats in one serving. Choose foods that have low or no amount of these fats.  Check the number of milligrams (mg) of sodium in one serving. Most people should limit total sodium intake to less than 2,300 mg per day.  Always check the nutrition information of foods labeled as "low-fat" or "nonfat". These foods may be higher in added sugar or refined carbohydrates and should be avoided.  Talk to your dietitian to identify your daily goals for nutrients listed on the label. Shopping  Avoid buying canned, premade, or processed foods. These foods tend to be high in fat, sodium, and added sugar.  Shop around the outside edge of the grocery store. This includes fresh fruits and vegetables, bulk grains, fresh meats, and fresh dairy. Cooking  Use low-heat cooking methods, such as baking, instead of high-heat cooking methods like deep frying.  Cook using healthy oils, such as olive, canola, or sunflower oil.  Avoid cooking with butter, cream, or high-fat meats. Meal planning  Eat meals and snacks regularly, preferably at the same times every day. Avoid going long periods of time without eating.  Eat foods high in fiber, such as fresh fruits, vegetables, beans, and whole grains. Talk to your dietitian about how many servings of carbohydrates you can eat at each meal.  Eat 4-6 ounces of lean protein each day, such as lean meat, chicken, fish, eggs, or tofu. 1 ounce is equal to 1 ounce of meat, chicken, or fish, 1 egg, or 1/4 cup of tofu.  Eat some foods each day  that contain healthy fats, such as avocado, nuts, seeds, and fish. Lifestyle   Check your blood glucose regularly.  Exercise at least 30 minutes 5 or more days each week, or as told by your health care provider.  Take medicines as told by your health care provider.  Do not use any products that contain nicotine or tobacco, such as cigarettes and e-cigarettes. If you need help quitting, ask your health care provider.  Work with a Veterinary surgeoncounselor or diabetes educator to identify strategies to manage stress and any emotional and social challenges. What are some questions to ask my health care provider?  Do I need to meet with a diabetes educator?  Do I need to meet with a dietitian?  What number can I call if I have questions?  When are the best times to check my blood glucose? Where to find more information:  American Diabetes Association: diabetes.org/food-and-fitness/food  Academy of Nutrition and Dietetics: https://www.vargas.com/www.eatright.org/resources/health/diseases-and-conditions/diabetes  [employer] of Diabetes and Digestive and Kidney Diseases (NIH): FindJewelers.czwww.niddk.nih.gov/health-information/diabetes/overview/diet-eating-physical-activity Summary  A healthy meal plan will help you control your blood glucose and maintain a  healthy lifestyle.  Working with a diet and nutrition specialist (dietitian) can help you make a meal plan that is best for you.  Keep in mind that carbohydrates and alcohol have immediate effects on your blood glucose levels. It is important to count carbohydrates and to use alcohol carefully. This information is not intended to replace advice given to you by your health care provider. Make sure you discuss any questions you have with your health care provider. Document Released: 09/13/2005 Document Revised: 01/21/2017 Document Reviewed: 01/21/2017 Elsevier Interactive Patient Education  [employer].

## 2018-01-07 NOTE — Progress Notes (Addendum)
Brief Nutrition Education Note  RD consulted for diet education related to diabetes.   Lab Results  Component Value Date   HGBA1C 10.7 (H) 01/06/2018   Spoke with pt and wife at bedside. Pt wife requests RD return later to attempt education, as pt is currently on the phone attempting to obtain an MD appointment.   Spoke with RN prior to visit, who reports pt would likely benefit from RD consult. Pt may also benefit from outpatient referral for outpatient DM management, which RD will order. Plan for pt to discharge tomorrow, 01/08/18; RD will re-attempt to follow-up prior discharge.   Catherene Kaleta A. Mayford KnifeWilliams, RD, LDN, CDE Pager: [phone] After hours Pager: [phone]

## 2018-01-07 NOTE — Anesthesia Postprocedure Evaluation (Signed)
Anesthesia Post Note  Patient: Ernest RosenthalJerry Franco  Procedure(s) Performed: INCISION AND DRAINAGE OF POSTERIOR NECK ABSCESS (N/A Neck)     Patient location during evaluation: PACU Anesthesia Type: General Level of consciousness: awake and alert Pain management: pain level controlled Vital Signs Assessment: post-procedure vital signs reviewed and stable Respiratory status: spontaneous breathing, nonlabored ventilation, respiratory function stable and patient connected to nasal cannula oxygen Cardiovascular status: blood pressure returned to baseline and stable Postop Assessment: no apparent nausea or vomiting Anesthetic complications: no    Last Vitals:  Vitals:   01/06/18 2244 01/07/18 0436  BP: 128/67 (!) 109/56  Pulse: 97 85  Resp: 18 18  Temp: (!) 36.4 C 36.7 C  SpO2: 94% 95%    Last Pain:  Vitals:   01/07/18 0454  TempSrc:   PainSc: 4                  Shelton SilvasKevin D Norwin Aleman
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
## 2018-01-08 ENCOUNTER — Encounter (HOSPITAL_COMMUNITY): Payer: Self-pay | Admitting: Nurse Practitioner

## 2018-01-08 DIAGNOSIS — E1165 Type 2 diabetes mellitus with hyperglycemia: Secondary | ICD-10-CM

## 2018-01-08 DIAGNOSIS — I10 Essential (primary) hypertension: Secondary | ICD-10-CM | POA: Diagnosis present

## 2018-01-08 DIAGNOSIS — Z6841 Body Mass Index (BMI) 40.0 and over, adult: Secondary | ICD-10-CM

## 2018-01-08 DIAGNOSIS — IMO0001 Reserved for inherently not codable concepts without codable children: Secondary | ICD-10-CM | POA: Diagnosis present

## 2018-01-08 LAB — BASIC METABOLIC PANEL
ANION GAP: 8 (ref 5–15)
BUN: 17 mg/dL (ref 6–20)
CHLORIDE: 101 mmol/L (ref 101–111)
CO2: 26 mmol/L (ref 22–32)
Calcium: 8.4 mg/dL — ABNORMAL LOW (ref 8.9–10.3)
Creatinine, Ser: 1.08 mg/dL (ref 0.61–1.24)
GFR calc Af Amer: >60 mL/min (ref >60)
GLUCOSE: 246 mg/dL — AB (ref 65–99)
Potassium: 4.1 mmol/L (ref 3.5–5.1)
Sodium: 135 mmol/L (ref 135–145)

## 2018-01-08 LAB — CBC
HEMATOCRIT: 45.1 % (ref 39.0–52.0)
HEMOGLOBIN: 14.6 g/dL (ref 13.0–17.0)
MCH: 28.8 pg (ref 26.0–34.0)
MCHC: 32.4 g/dL (ref 30.0–36.0)
MCV: 89 fL (ref 78.0–100.0)
PLATELETS: 196 10*3/uL (ref 150–400)
RBC: 5.07 MIL/uL (ref 4.22–5.81)
RDW: 12.9 % (ref 11.5–15.5)
WBC: 11 10*3/uL — AB (ref 4.0–10.5)

## 2018-01-08 LAB — GLUCOSE, CAPILLARY
Glucose-Capillary: 206 mg/dL — ABNORMAL HIGH (ref 65–99)
Glucose-Capillary: 218 mg/dL — ABNORMAL HIGH (ref 65–99)

## 2018-01-08 MED ORDER — INSULIN GLARGINE 100 UNIT/ML ~~LOC~~ SOLN: 15.0000 [IU] | Freq: Every day | SUBCUTANEOUS | 0 refills | Status: DC

## 2018-01-08 MED ORDER — AMOXICILLIN-POT CLAVULANATE 875-125 MG PO TABS: 1.0000 | ORAL_TABLET | Freq: Two times a day (BID) | ORAL | 0 refills | Status: AC

## 2018-01-08 MED ORDER — INSULIN STARTER KIT- PEN NEEDLES (ENGLISH): 1.0000 | Freq: Once | 0 refills | Status: AC

## 2018-01-08 MED ORDER — GLUCOSE BLOOD VI STRP: ORAL_STRIP | 0 refills | Status: DC

## 2018-01-08 MED ORDER — OXYCODONE HCL 5 MG PO TABS: 5.0000 mg | ORAL_TABLET | Freq: Four times a day (QID) | ORAL | 0 refills | Status: DC | PRN

## 2018-01-08 MED ORDER — FREESTYLE SYSTEM KIT: 1.0000 | PACK | 0 refills | Status: DC | PRN

## 2018-01-08 MED ORDER — GLIPIZIDE 5 MG PO TABS: 5.0000 mg | ORAL_TABLET | Freq: Two times a day (BID) | ORAL | 0 refills | Status: DC

## 2018-01-08 MED ORDER — INSULIN STARTER KIT- PEN NEEDLES (ENGLISH)
1.0000 | Freq: Once | Status: AC
  Administered 2018-01-08: 1
  Filled 2018-01-08: qty 1

## 2018-01-08 MED ORDER — BASAGLAR KWIKPEN 100 UNIT/ML ~~LOC~~ SOPN: 15.0000 [IU] | PEN_INJECTOR | Freq: Every day | SUBCUTANEOUS | 1 refills | Status: DC

## 2018-01-08 NOTE — Consult Note (Signed)
Consultation Note   Ernest Franco ZOX:[id number] DOB: 10-04-1971 DOA: 01/06/2018   PCP: Cheral Bay, MD   Attending physician: [facility]  Consulting physician: [facility]/internal medicine  Reason for consultation: Assist with poorly controlled diabetes  HPI: Ernest Franco is a 47 y.o. male with medical history significant for orbit obesity, diet-controlled diabetes, hypertension, reflux, and depression.  Patient was admitted by the surgical service on 1/7 Ernest Franco to a posterior neck abscess that required intraoperative drainage.  Since admission glucoses have ranged from 187 to 404 noting patient did receive 1 dose of IV Decadron at time of admission.  Diabetes educator has evaluated the patient.  Patient has been guarded on glipizide twice daily as well as Lantus insulin subcu with most recent CBG 246.  In discussing with the patient over the last 6-8 months his PCP took him off of oral medications and he is focused on diet control of his diabetes.  He does not have a glucometer at home therefore was unable to determine how well his sugars were controlled on diet alone.  He had previously been on metformin greater than 1 year ago but this was discontinued due to unwanted side effects-patient thinks were primarily GI in etiology.  Patient previously has undergone full evaluation for bariatric surgery but this was unable to be accomplished due to insurance not approving/paying for procedure.  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  Review of Systems:  In addition to the HPI above,  No Headache, changes with Vision or hearing, new weakness, tingling, numbness in any extremity, dizziness, dysarthria or word finding difficulty, gait disturbance or imbalance, tremors or seizure activity No problems swallowing food or Liquids, indigestion/reflux, choking or coughing while eating, abdominal pain with or after eating No  Chest pain, Cough or Shortness of Breath, palpitations, orthopnea or DOE No Abdominal pain, N/V, melena,hematochezia, dark tarry stools, constipation No dysuria, malodorous urine, hematuria or flank pain No new skin rashes, masses or bruises, No new joint pains, aches, swelling or redness No recent unintentional weight gain or loss No polyuria, polydypsia or polyphagia   Past Medical History:  Diagnosis Date  . Depression   . Essential hypertension   . GERD (gastroesophageal reflux disease)   . Headache   . Morbid obesity (HCC)   . Pre-diabetes     Past Surgical History:  Procedure Laterality Date  . CHOLECYSTECTOMY    . CHOLECYSTECTOMY    . INCISE AND DRAIN ABCESS  01/06/2018   POST  NECK  . INCISION AND DRAINAGE ABSCESS N/A 01/06/2018   Procedure: INCISION AND DRAINAGE OF POSTERIOR NECK ABSCESS;  Surgeon: Manus Rudd, MD;  Location: [facility] OR;  Service: General;  Laterality: N/A;  . KNEE SURGERY      Social History   Socioeconomic History  . Marital status: Married    Spouse name: Not on file  . Number of children: Not on file  . Years of education: Not on file  . Highest education level: Not on file  Social Needs  . Financial resource strain: Not on file  . Food insecurity - worry: Not on file  . Food insecurity - inability: Not on file  . Transportation needs - medical: Not on file  . Transportation needs - non-medical: Not on file  Occupational History  . Not on file  Tobacco Use  . Smoking status: Never Smoker  . Smokeless tobacco: Never Used  Substance and Sexual  Activity  . Alcohol use: No  . Drug use: No  . Sexual activity: Not on file  Other Topics Concern  . Not on file  Social History Narrative  . Not on file    Mobility: Independent Work history: Drives a utility truck   No Known Allergies  Family History  Problem Relation Age of Onset  . Diabetes Mellitus II Mother     Prior to Admission medications   Medication Sig Start Date End Date  Taking? Authorizing Provider  buPROPion (WELLBUTRIN XL) 300 MG 24 hr tablet Take 300 mg by mouth daily.   Yes [provider]  furosemide (LASIX) 20 MG tablet Take 20 mg by mouth 2 (two) times daily.   Yes [provider]  GLIPIZIDE PO Take by mouth.   Yes [provider]  oxyCODONE-acetaminophen (PERCOCET) 5-325 MG tablet Take 1 tablet by mouth every 4 (four) hours as needed for severe pain. 01/02/18  Yes Little, Ambrose Finlandachel Morgan, MD  sertraline (ZOLOFT) 50 MG tablet Take 50 mg by mouth daily. 12/04/17  Yes [provider]  aspirin 325 MG tablet Take 1 tablet (325 mg total) by mouth daily. Patient not taking: Reported on 01/06/2018 10/05/16   Ernest Franco, Ernest U, MD    Physical Exam: Vitals:   01/07/18 1343 01/07/18 2206 01/07/18 2333 01/08/18 0510  BP: (!) 104/59 (!) 86/43 (!) 108/53 106/60  Pulse: 72 81  73  Resp:    12  Temp: 97.6 F (36.4 C) 98.2 F (36.8 C)  97.7 F (36.5 C)  TempSrc: Oral Oral  Oral  SpO2: 95% 96%  96%  Weight:          Constitutional: NAD, calm, comfortable Eyes: PERRL, lids and conjunctivae normal ENMT: Mucous membranes are moist. Posterior pharynx clear of any exudate or lesions.Normal dentition.  Neck: normal, supple, no masses, no thyromegaly Respiratory: clear to auscultation bilaterally, no wheezing, no crackles. Normal respiratory effort. No accessory muscle use.  Cardiovascular: Regular rate and rhythm, no murmurs / rubs / gallops. No extremity edema. 2+ pedal pulses. No carotid bruits.  Abdomen: no tenderness, no masses palpated. No hepatosplenomegaly. Bowel sounds positive.  Musculoskeletal: no clubbing / cyanosis. No joint deformity upper and lower extremities. Good ROM, no contractures. Normal muscle tone.  Skin: no generalized rashes, lesions, ulcers-did not examine posterior neck surgical wound Neurologic: CN 2-12 grossly intact. Sensation intact, DTR normal. Strength 5/5 x all 4 extremities.  Psychiatric: Normal  judgment and insight. Alert and oriented x 3. Normal mood.    Labs on Admission: I have personally reviewed following labs and imaging studies  CBC: Recent Labs  Lab 01/01/18 2159 01/06/18 1355 01/06/18 1802 01/07/18 0855 01/08/18 0534  WBC 8.6 8.4 10.9* 14.4* 11.0*  NEUTROABS 5.4  --   --   --   --   HGB 15.3 16.5 16.6 15.2 14.6  HCT 44.5 47.8 48.6 46.2 45.1  MCV 85.7 86.3 88.2 87.8 89.0  PLT 201 272 231 238 196   Basic Metabolic Panel: Recent Labs  Lab 01/01/18 2159 01/06/18 1802 01/06/18 2218 01/07/18 0855 01/08/18 0534  NA 135  --   --  132* 135  K 4.2  --   --  4.8 4.1  CL 100*  --   --  97* 101  CO2 27  --   --  24 26  GLUCOSE 357*  --  404* 372* 246*  BUN 10  --   --  17 17  CREATININE 0.79 1.06  --  1.19 1.08  CALCIUM 9.2  --   --  8.7* 8.4*   GFR: Estimated Creatinine Clearance: 129.6 mL/min (by C-G formula based on SCr of 1.08 mg/dL). Liver Function Tests: Recent Labs  Lab 01/01/18 2159  AST 36  ALT 47  ALKPHOS 89  BILITOT 0.5  PROT 7.8  ALBUMIN 3.4*   No results for input(s): LIPASE, AMYLASE in the last 168 hours. No results for input(s): AMMONIA in the last 168 hours. Coagulation Profile: No results for input(s): INR, PROTIME in the last 168 hours. Cardiac Enzymes: No results for input(s): CKTOTAL, CKMB, CKMBINDEX, TROPONINI in the last 168 hours. BNP (last 3 results) No results for input(s): PROBNP in the last 8760 hours. HbA1C: Recent Labs    01/06/18 1802  HGBA1C 10.7*   CBG: Recent Labs  Lab 01/07/18 0805 01/07/18 1151 01/07/18 1648 01/07/18 2132 01/08/18 0737  GLUCAP 319* 332* 413* 313* 206*   Lipid Profile: No results for input(s): CHOL, HDL, LDLCALC, TRIG, CHOLHDL, LDLDIRECT in the last 72 hours. Thyroid Function Tests: No results for input(s): TSH, T4TOTAL, FREET4, T3FREE, THYROIDAB in the last 72 hours. Anemia Panel: No results for input(s): VITAMINB12, FOLATE, FERRITIN, TIBC, IRON, RETICCTPCT in the last 72  hours. Urine analysis: No results found for: COLORURINE, APPEARANCEUR, LABSPEC, PHURINE, GLUCOSEU, HGBUR, BILIRUBINUR, KETONESUR, PROTEINUR, UROBILINOGEN, NITRITE, LEUKOCYTESUR Sepsis Labs: @LABRCNTIP (procalcitonin:4,lacticidven:4) ) Recent Results (from the past 240 hour(s))  Aerobic/Anaerobic Culture (surgical/deep wound)     Status: None (Preliminary result)   Collection Time: 01/06/18  2:28 PM  Result Value Ref Range Status   Specimen Description ABSCESS NECK  Final   Special Requests NONE  Final   Gram Stain   Final    MODERATE WBC PRESENT, PREDOMINANTLY PMN FEW GRAM POSITIVE COCCI IN CLUSTERS    Culture FEW ENTEROCOCCUS FAECALIS  Final   Report Status PENDING  Incomplete     Radiological Exams on Admission: No results found.  EKG: (Independently reviewed) sinus rhythm with ventricular rate 80 bpm, QTC 447 ms, normal R wave rotation, no acute ischemic changes-normal EKG  Assessment/Plan Principal Problem:   Uncontrolled diabetes mellitus type 2 without complications  -Patient presents with a posterior neck abscess requiring surgical I&D with subsequent hyperglycemia in the postoperative. -HgbA1c = 10.7 which is reflective of long-term poor glycemic control -Patient reports for 6-8 months has been "diet controlled" although did not have a glucometer and therefore was unable to follow sugars to determine if diet management was actually effective -Glipizide as well as Lantus has been initiated this admission -Continue glipizide milligrams twice daily at discharge -Has required 65 units of regular insulin in the past 24 hours; most recent CBG 206-patient is new start on insulin and in conjunction with oral hypoglycemics is at risk for hypoglycemia therefore will hold off on increasing Lantus past 15 units now -I discussed with patient and wife long-term complications including endorgan injury and poorly controlled diabetes to include but not limited to: Vision loss, injury to  kidneys potentially leading to dialysis, cardiovascular disease that can include stroke, heart attack or circulation problems to the extremities, possibility of developing diabetic peripheral nephropathy which can lead to difficult to control pain management issues. -Have asked case management to investigate the possibility of an insulin injector pen-if insurance denies patient will need prescription for Lantus insulin as well as for insulin syringes -He will also need a prescription for a glucometer and other accessories including alcohol preps-case management can assist to determine which device and accessories were paid for  by patient's insurance -I have also asked case management to investigate outpatient diabetes education to supplement PCP management -I have instructed the patient that he needs to follow-up with his PCP this week if can arrange appointment regarding close follow-up of diabetes given reinitiation of medications including new initiation of insulin -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 HTN (hypertension) -Reported as past medical problem but was not on medications prior to admission and current blood pressures are well controlled off medications    Morbid obesity with BMI of 60.0-69.9, adult  -Documented above, patient and wife report insurance rejected payment for bariatric surgery -Discussed with patient need to slowly make changes to his weight.  Recommended food diary as well as increasing physical activity to include a minimum of 20 minutes daily of brisk walking reinforcing that does not only burns calories but aids and appropriate muscle and tissue utilization of insulin. -I also discussed obtaining a smart device  that helps track activity and calories      DVT prophylaxis: Lovenox Code Status: Full Family Communication: Wife Disposition Plan: Home    ELLIS,ALLISON L. ANP-BC [facility] Pager [phone]   If 7PM-7AM, please contact night-coverage [email or web address] Password [email or web address]  01/08/2018, 9:14 AM

## 2018-01-08 NOTE — Progress Notes (Signed)
Diabetic teachings reinforced. Pt did demonstrated self injecting of insulin. Discharge instructions given to pt and wife. Pt's wife showed how to do dressing changes. Discharged to home.

## 2018-01-08 NOTE — Progress Notes (Signed)
Inpatient Diabetes Program Recommendations  AACE/ADA: New Consensus Statement on Inpatient Glycemic Control (2015)  Target Ranges:  Prepandial:   less than 140 mg/dL      Peak postprandial:   less than 180 mg/dL (1-2 hours)      Critically ill patients:  140 - 180 mg/dL   Results for Ernest Franco, Ernest Franco (MRN [id number]) as of 01/08/2018 13:30  Ref. Range 01/07/2018 00:27 01/07/2018 08:05 01/07/2018 11:51 01/07/2018 16:48 01/07/2018 21:32 01/08/2018 07:37 01/08/2018 11:42  Glucose-Capillary Latest Ref Range: 65 - 99 mg/dL 377 (H) 319 (H) 332 (H) 413 (H) 313 (H) 206 (H) 218 (H)    NOTE: Initial glucose 299 mg/dl on 01/06/18 and glucose ranged from 206-413 mg/dl over the past 24 hours.  Patient was given Decadron 5 mg x 1 on 01/06/18 which is contributing to hyperglycemia. Noted patient will be discharged on basal insulin pens. Met with patient and his wife again today. Patient was in the process of self injecting insulin with RN and done well with self injecting insulin.  Discussed Novolog and Lantus as currently ordered and reviewed how each insulin works and duration. Explained that it appears patient will discharge on basal insulin along with Glipizide.  Educated patient and spouse on insulin pen use at home. Ordered insulin pen starter kit earlier this morning but patient has not received it yet. Reviewed all steps of insulin pen including attachment of needle, 2-unit air shot, dialing up dose, giving injection, removing needle, disposal of sharps, storage of unused insulin, disposal of insulin etc. Patient able to provide successful return demonstration. Discussed hypoglycemia along with proper treatment. Encouraged patient to get glucose tablets to have on hand at all times to use in case of hypoglycemia. Encouraged patient to consistently check glucose 2-3 times a day and keep a log of glucose readings. Encouraged patient to call his PCP if he experiences any glucose values less than 70 mg/dl for advice on changes with DM  medications. Patient and his wife verbalized understanding of information discussed and report they have no questions or concerns at this time.   MD to give patient Rxs for: glucometer, testing supplies, insulin pens, and insulin pen needles.  Thanks, Barnie Alderman, RN, MSN, CDE Diabetes Coordinator Inpatient Diabetes Program [phone] (Team Pager from 8am to 5pm)

## 2018-01-08 NOTE — Progress Notes (Signed)
  [facility] Progress Note  2 Days Post-Op  Subjective: CC-  Patient reports that he slept better last night. Posterior neck pain improving. Tolerating dressing changes well. Blood glucose 313 last night and 246 this AM. Started glipizide and lantus last night.  Objective: Vital signs in last 24 hours: Temp:  [97.6 F (36.4 C)-98.2 F (36.8 C)] 97.7 F (36.5 C) (01/09 0510) Pulse Rate:  [72-81] 73 (01/09 0510) Resp:  [12-14] 12 (01/09 0510) BP: (86-111)/(43-60) 106/60 (01/09 0510) SpO2:  [93 %-96 %] 96 % (01/09 0510) Last BM Date: 01/06/18  Intake/Output from previous day: 01/08 0701 - 01/09 0700 In: 1279.7 [P.O.:900; I.V.:279.7; IV Piggyback:100] Out: -  Intake/Output this shift: No intake/output data recorded.  PE: Gen:  Alert, NAD, pleasant HEENT: EOM's intact, pupils equal and round Pulm:  effort normal Psych: A&Ox3 Skin: posterior neck abscess s/p I&D about 3x3x3cm with no bloody or purulent drainage  Lab Results:  Recent Labs    01/07/18 0855 01/08/18 0534  WBC 14.4* 11.0*  HGB 15.2 14.6  HCT 46.2 45.1  PLT 238 196   BMET Recent Labs    01/07/18 0855 01/08/18 0534  NA 132* 135  K 4.8 4.1  CL 97* 101  CO2 24 26  GLUCOSE 372* 246*  BUN 17 17  CREATININE 1.19 1.08  CALCIUM 8.7* 8.4*   PT/INR No results for input(s): LABPROT, INR in the last 72 hours. CMP     Component Value Date/Time   NA 135 01/08/2018 0534   K 4.1 01/08/2018 0534   CL 101 01/08/2018 0534   CO2 26 01/08/2018 0534   GLUCOSE 246 (H) 01/08/2018 0534   BUN 17 01/08/2018 0534   CREATININE 1.08 01/08/2018 0534   CALCIUM 8.4 (L) 01/08/2018 0534   PROT 7.8 01/01/2018 2159   ALBUMIN 3.4 (L) 01/01/2018 2159   AST 36 01/01/2018 2159   ALT 47 01/01/2018 2159   ALKPHOS 89 01/01/2018 2159   BILITOT 0.5 01/01/2018 2159   GFRNONAA >60 01/08/2018 0534   GFRAA >60 01/08/2018 0534   Lipase  No results found for: LIPASE     Studies/Results: No results  found.  Anti-infectives: Anti-infectives (From admission, onward)   Start     Dose/Rate Route Frequency Ordered Stop   01/06/18 1800  piperacillin-tazobactam (ZOSYN) IVPB 3.375 g     3.375 g 12.5 mL/hr over 240 Minutes Intravenous Every 8 hours 01/06/18 1711         Assessment/Plan DM - appreciate diabetes coordinator input. SSI, lantus 15 qd, glipizide 5mg  BID. Glucose still 246 this AM, will consult internal medicine Morbid obesity GERD HTN - home meds Depression - home meds  Deep soft tissue abscess posterior neck S/p Incision and drainage of deep soft tissue posterior neck abscess 1/7 Dr. Corliss Skainssuei - POD 2 - culture: FEW ENTEROCOCCUS FAECALIS, report pending - BID wet to dry dressing changes  ID - zosyn 1/7>>day#3 FEN - carb modified diet VTE - lovenox, SCDs Foley - none Follow up - [facility] clinic 1-2 weeks  Plan - Consulting internal medicine for assistance with diabetes management. Continue BID wet to dry dressing changes. Continue IV antibiotics for now. Follow culture. Possible discharge this afternoon if blood glucose improves.   LOS: 2 days    Franne FortsBrooke A Meuth , [facility] 01/08/2018, 7:36 AM Pager: [phone] Consults: [phone] Mon-Fri 7:00 am-4:30 pm Sat-Sun 7:00 am-11:30 am

## 2018-01-08 NOTE — Discharge Summary (Signed)
[facility] Discharge Summary   Patient ID: Ernest Franco MRN: [id number] DOB/AGE: 1971/12/15 47 y.o.  Admit date: 01/06/2018 Discharge date: 01/08/2018  Admitting Diagnosis: Posterior neck abscess  Discharge Diagnosis Patient Active Problem List   Diagnosis Date Noted  . Uncontrolled diabetes mellitus type 2 without complications ([place]) 40/98/1191  . HTN (hypertension) 01/08/2018  . Morbid obesity with BMI of 60.0-69.9, adult ([place]) 01/08/2018  . Neck abscess 01/06/2018  . CVA (cerebral vascular accident) ([place]) 10/04/2016  . Chest pain 10/03/2016  . Hyperglycemia 10/03/2016  . Elevated blood pressure reading 10/03/2016  . Chronic edema 10/03/2016  . Muscle weakness of left upper extremity 10/03/2016  . TIA (transient ischemic attack) 10/02/2016    Consultants Internal medicine  Imaging: No results found.  Procedures Dr. Georgette Dover (01/06/17) - Incision and drainage of deep soft tissue posterior neck abscess  Hospital Course:  Ernest Franco is a 47yo male who was direct admitted to [facility]7 [facility] with worsening posterior neck abscess.  Abscess had been aspirated and an incision made 1/3 to allow abscess to drain; he was started on antibiotics but his neck pain continued to worsen. Patient was admitted and underwent procedure listed above.  Tolerated procedure well and was transferred to the floor. Patient had difficulty with hyperglycemia therefore our diabetes coordinator and internal medicine were consulted for assistance. He was started on insulin and glipizide during this admission. On POD2 the patient was voiding well, tolerating diet, ambulating well, pain well controlled, vital signs stable, incisions c/d/i and felt stable for discharge home.  He was sent home with 7 days of augmentin; intraoperative culture still pending at time of discharge. Patient will follow up with his PCP within 1 week for management of his diabetes, and in our  office in 1-2 weeks. Patient knows to call with questions or concerns.     I have personally reviewed the patients medication history on the [place] controlled substance database.    Allergies as of 01/08/2018   No Known Allergies     Medication List    STOP taking these medications   oxyCODONE-acetaminophen 5-325 MG tablet Commonly known as:  PERCOCET     TAKE these medications   amoxicillin-clavulanate 875-125 MG tablet Commonly known as:  AUGMENTIN Take 1 tablet by mouth 2 (two) times daily for 7 days.   aspirin 325 MG tablet Take 1 tablet (325 mg total) by mouth daily.   buPROPion 300 MG 24 hr tablet Commonly known as:  WELLBUTRIN XL Take 300 mg by mouth daily.   furosemide 20 MG tablet Commonly known as:  LASIX Take 20 mg by mouth 2 (two) times daily.   glipiZIDE 5 MG tablet Commonly known as:  GLUCOTROL Take 1 tablet (5 mg total) by mouth 2 (two) times daily with a meal. What changed:    medication strength  how much to take  when to take this   glucose blood test strip Commonly known as:  COOL BLOOD GLUCOSE TEST STRIPS Use as instructed   glucose monitoring kit monitoring kit 1 each by Does not apply route as needed for other.   insulin glargine 100 UNIT/ML injection Commonly known as:  LANTUS Inject 0.15 mLs (15 Units total) into the skin at bedtime.   insulin starter kit- pen needles Misc 1 kit by Other route once for 1 dose.   oxyCODONE 5 MG immediate release tablet Commonly known as:  Oxy IR/ROXICODONE Take 1 tablet (5 mg total) by mouth every  6 (six) hours as needed for severe pain.   sertraline 50 MG tablet Commonly known as:  ZOLOFT Take 50 mg by mouth daily.        Follow-up Information    Maylon Peppers, MD. Call in 1 week(s).   Specialty:  Family Medicine Why:  Please call to make a follow up appointment regarding diabetes management Contact information: [place]  [address] [facility], [place]. Go on 01/14/2018.   Specialty:  General Surgery Why:  Your appointment is 01/14/18 at 10:30 am. Arrive 15 minutes prior to your appointment to check in and fill out paperwork. Bring insurance information and photo ID. Contact information: [address] [place] [phone]          Signed: [place], [facility] 01/08/2018, 10:44 AM Pager: [phone] Consults: [phone] Mon-Fri 7:00 am-4:30 pm Sat-Sun 7:00 am-11:30 am

## 2018-01-08 NOTE — Care Management (Addendum)
Ernest Franco, Ernest Franco  Crista Nuon M, RN        # 2.  S/W DAVID @ [employer] MANAGED RX # [phone]   COAST OF ONE LANTUS PEN / NEEDLES  COVER- NOT COVER  PRIOR APPROVAL- YES # [id number]   ALTERNATIVE:   1. BASAGLAR  COVER- YES  CO-PAY- $ 35.00   2. LEVEMIR  COVER- YES  CO-PAY- $ 35.00   PREFERRED PHARMACY : [facility]    Text paged MattelBrooke Meuth PA   Ronny FlurryHeather Lavert Matousek RN BSN [phone]

## 2018-01-08 NOTE — Progress Notes (Signed)
Reviewed discharge summary and noted patient was on Lasix 20 twice daily prior to admission.  Current systolic blood pressure 106.  At this juncture no indication to continue Lasix.  I have discussed with surgery PA Carlena BjornstadBrooke Meuth who will discontinue Lasix.  Patient is to bring this medication to his follow-up visit with his PCP to determine if this medicine needs to be resumed after discharge.  Junious SilkAllison Ellis, ANP

## 2018-01-09 ENCOUNTER — Other Ambulatory Visit: Payer: Self-pay | Admitting: Surgery

## 2018-01-11 LAB — AEROBIC/ANAEROBIC CULTURE W GRAM STAIN (SURGICAL/DEEP WOUND)

## 2018-02-21 ENCOUNTER — Other Ambulatory Visit: Payer: Self-pay | Admitting: Neurosurgery

## 2018-02-21 DIAGNOSIS — M5124 Other intervertebral disc displacement, thoracic region: Secondary | ICD-10-CM

## 2018-03-04 ENCOUNTER — Ambulatory Visit
Admission: RE | Admit: 2018-03-04 | Discharge: 2018-03-04 | Disposition: A | Discharge status: Home / Self Care | Payer: Worker's Compensation | Source: Ambulatory Visit | Attending: Neurosurgery | Admitting: Neurosurgery

## 2018-03-04 DIAGNOSIS — M5124 Other intervertebral disc displacement, thoracic region: Secondary | ICD-10-CM

## 2018-03-13 ENCOUNTER — Other Ambulatory Visit (HOSPITAL_COMMUNITY): Payer: Self-pay | Admitting: Neurosurgery

## 2018-03-13 DIAGNOSIS — M5124 Other intervertebral disc displacement, thoracic region: Secondary | ICD-10-CM

## 2018-03-26 ENCOUNTER — Encounter (HOSPITAL_COMMUNITY): Payer: Self-pay | Admitting: *Deleted

## 2018-03-26 ENCOUNTER — Other Ambulatory Visit: Payer: Self-pay

## 2018-03-26 NOTE — Progress Notes (Signed)
Pt denies SOB, chest pain, and being under the care of a cardiologist. Pt denies having a cardiac cath but stated that a stress test was performed 20 years ago. Pt denies having a chest x ray within the last year. Pt denies recent labs. Pt made aware to stop taking vitamins and herbal medications. Pt made aware to take 7 units of Basaglar insulin tonight, no Jardiance and Januvia on DOS. Pt made aware to check BG every 2 hours prior to arrival to hospital on DOS. Pt made aware to treat a BG < 70 with 4 ounces of cranberry juice, wait 15 minutes after intervention to recheck BG, if BG remains < 70, call Short Stay unit to speak with a nurse. Pt verbalized understanding of all pre-op instructions.

## 2018-03-27 ENCOUNTER — Ambulatory Visit (HOSPITAL_COMMUNITY)
Admission: RE | Admit: 2018-03-27 | Discharge: 2018-03-27 | Disposition: A | Discharge status: Home / Self Care | Payer: No Typology Code available for payment source | Source: Ambulatory Visit | Attending: Radiology | Admitting: Radiology

## 2018-03-27 ENCOUNTER — Encounter (HOSPITAL_COMMUNITY)
Admission: RE | Disposition: A | Discharge status: Home / Self Care | Payer: Self-pay | Source: Ambulatory Visit | Attending: Neurosurgery

## 2018-03-27 ENCOUNTER — Ambulatory Visit (HOSPITAL_COMMUNITY)
Admission: RE | Admit: 2018-03-27 | Discharge: 2018-03-27 | Disposition: A | Discharge status: Home / Self Care | Payer: No Typology Code available for payment source | Source: Ambulatory Visit | Attending: Neurosurgery | Admitting: Neurosurgery

## 2018-03-27 ENCOUNTER — Ambulatory Visit (HOSPITAL_COMMUNITY): Payer: No Typology Code available for payment source | Admitting: Certified Registered"

## 2018-03-27 ENCOUNTER — Encounter (HOSPITAL_COMMUNITY): Payer: Self-pay | Admitting: *Deleted

## 2018-03-27 DIAGNOSIS — M4804 Spinal stenosis, thoracic region: Secondary | ICD-10-CM | POA: Diagnosis not present

## 2018-03-27 DIAGNOSIS — E669 Obesity, unspecified: Secondary | ICD-10-CM | POA: Diagnosis not present

## 2018-03-27 DIAGNOSIS — M5124 Other intervertebral disc displacement, thoracic region: Secondary | ICD-10-CM

## 2018-03-27 DIAGNOSIS — I10 Essential (primary) hypertension: Secondary | ICD-10-CM | POA: Diagnosis not present

## 2018-03-27 DIAGNOSIS — Z7984 Long term (current) use of oral hypoglycemic drugs: Secondary | ICD-10-CM | POA: Diagnosis not present

## 2018-03-27 DIAGNOSIS — Z79899 Other long term (current) drug therapy: Secondary | ICD-10-CM | POA: Insufficient documentation

## 2018-03-27 DIAGNOSIS — Z6841 Body Mass Index (BMI) 40.0 and over, adult: Secondary | ICD-10-CM | POA: Insufficient documentation

## 2018-03-27 HISTORY — DX: Other chronic pain: G89.29

## 2018-03-27 HISTORY — PX: RADIOLOGY WITH ANESTHESIA: SHX6223

## 2018-03-27 HISTORY — DX: Sleep apnea, unspecified: G47.30

## 2018-03-27 HISTORY — DX: Dorsalgia, unspecified: M54.9

## 2018-03-27 LAB — BASIC METABOLIC PANEL
ANION GAP: 8 (ref 5–15)
BUN: 17 mg/dL (ref 6–20)
CO2: 25 mmol/L (ref 22–32)
Calcium: 9.1 mg/dL (ref 8.9–10.3)
Chloride: 105 mmol/L (ref 101–111)
Creatinine, Ser: 1.2 mg/dL (ref 0.61–1.24)
GFR calc Af Amer: >60 mL/min (ref >60)
GFR calc non Af Amer: >60 mL/min (ref >60)
GLUCOSE: 140 mg/dL — AB (ref 65–99)
POTASSIUM: 3.6 mmol/L (ref 3.5–5.1)
Sodium: 138 mmol/L (ref 135–145)

## 2018-03-27 LAB — GLUCOSE, CAPILLARY
GLUCOSE-CAPILLARY: 131 mg/dL — AB (ref 65–99)
Glucose-Capillary: 133 mg/dL — ABNORMAL HIGH (ref 65–99)

## 2018-03-27 LAB — CBC
HCT: 49.4 % (ref 39.0–52.0)
HEMOGLOBIN: 17 g/dL (ref 13.0–17.0)
MCH: 30.4 pg (ref 26.0–34.0)
MCHC: 34.4 g/dL (ref 30.0–36.0)
MCV: 88.4 fL (ref 78.0–100.0)
Platelets: 228 10*3/uL (ref 150–400)
RBC: 5.59 MIL/uL (ref 4.22–5.81)
RDW: 13.4 % (ref 11.5–15.5)
WBC: 9.4 10*3/uL (ref 4.0–10.5)

## 2018-03-27 SURGERY — MRI WITH ANESTHESIA
Anesthesia: General

## 2018-03-27 MED ORDER — FENTANYL CITRATE (PF) 100 MCG/2ML IJ SOLN: 25.0000 ug | INTRAMUSCULAR | Status: DC | PRN

## 2018-03-27 MED ORDER — ONDANSETRON HCL 4 MG/2ML IJ SOLN: 4.0000 mg | Freq: Once | INTRAMUSCULAR | Status: DC | PRN

## 2018-03-27 MED ORDER — LACTATED RINGERS IV SOLN
INTRAVENOUS | Status: DC
  Administered 2018-03-27: 08:00:00 via INTRAVENOUS

## 2018-03-27 NOTE — Transfer of Care (Signed)
Immediate Anesthesia Transfer of Care Note  Patient: Ernest RosenthalJerry Kluesner  Procedure(s) Performed: MRI WITH THORACIC SPINE WITHOUT CONTRAST (N/A )  Patient Location: PACU  Anesthesia Type:General  Level of Consciousness: awake, alert  and oriented  Airway & Oxygen Therapy: Patient connected to face mask oxygen  Post-op Assessment: Post -op Vital signs reviewed and stable  Post vital signs: stable  Last Vitals:  Vitals Value Taken Time  BP 117/67 03/27/2018 12:43 PM  Temp 36.5 C 03/27/2018 12:45 PM  Pulse 88 03/27/2018 12:47 PM  Resp 9 03/27/2018 12:47 PM  SpO2 93 % 03/27/2018 12:47 PM  Vitals shown include unvalidated device data.  Last Pain:  Vitals:   03/27/18 1245  TempSrc:   PainSc: 0-No pain      Patients Stated Pain Goal: 9 (03/27/18 0737)  Complications: No apparent anesthesia complications

## 2018-03-27 NOTE — Anesthesia Preprocedure Evaluation (Signed)
Anesthesia Evaluation  Patient identified by MRN, date of birth, ID band Patient awake    Reviewed: Allergy & Precautions, NPO status , Patient's Chart, lab work & pertinent test results  Airway Mallampati: III  TM Distance: >3 FB Neck ROM: Full    Dental  (+) Teeth Intact, Dental Advisory Given   Pulmonary    breath sounds clear to auscultation       Cardiovascular hypertension,  Rhythm:Regular Rate:Normal     Neuro/Psych    GI/Hepatic   Endo/Other    Renal/GU      Musculoskeletal   Abdominal (+) + obese,   Peds  Hematology   Anesthesia Other Findings   Reproductive/Obstetrics                             Anesthesia Physical Anesthesia Plan  ASA: III  Anesthesia Plan: General   Post-op Pain Management:    Induction: Intravenous  PONV Risk Score and Plan: Ondansetron  Airway Management Planned: Oral ETT  Additional Equipment:   Intra-op Plan:   Post-operative Plan:   Informed Consent: I have reviewed the patients History and Physical, chart, labs and discussed the procedure including the risks, benefits and alternatives for the proposed anesthesia with the patient or authorized representative who has indicated his/her understanding and acceptance.   Dental advisory given  Plan Discussed with: CRNA and Anesthesiologist  Anesthesia Plan Comments:         Anesthesia Quick Evaluation

## 2018-03-27 NOTE — Anesthesia Postprocedure Evaluation (Signed)
Anesthesia Post Note  Patient: Ernest RosenthalJerry Franco  Procedure(s) Performed: MRI WITH THORACIC SPINE WITHOUT CONTRAST (N/A )     Patient location during evaluation: PACU Anesthesia Type: General Level of consciousness: awake and alert Pain management: pain level controlled Vital Signs Assessment: post-procedure vital signs reviewed and stable Respiratory status: spontaneous breathing, nonlabored ventilation, respiratory function stable and patient connected to nasal cannula oxygen Cardiovascular status: blood pressure returned to baseline and stable Postop Assessment: no apparent nausea or vomiting Anesthetic complications: no    Last Vitals:  Vitals:   03/27/18 1243 03/27/18 1245  BP: 117/67   Pulse: 87   Resp: 18   Temp:  36.5 C  SpO2: 99%     Last Pain:  Vitals:   03/27/18 1245  TempSrc:   PainSc: 0-No pain                 Kareena Arrambide COKER

## 2018-03-27 NOTE — Discharge Instructions (Signed)

## 2018-03-28 ENCOUNTER — Encounter (HOSPITAL_COMMUNITY): Payer: Self-pay | Admitting: Radiology

## 2018-03-28 MED FILL — Lactated Ringer's Solution: INTRAVENOUS | Qty: 1000 | Status: AC

## 2018-03-28 MED FILL — Lidocaine HCl Local Soln Prefilled Syringe 100 MG/5ML (2%): INTRAMUSCULAR | Qty: 5 | Status: AC

## 2018-03-28 MED FILL — Midazolam HCl Inj 2 MG/2ML (Base Equivalent): INTRAMUSCULAR | Qty: 2 | Status: AC

## 2018-03-28 MED FILL — Propofol IV Emul 200 MG/20ML (10 MG/ML): INTRAVENOUS | Qty: 20 | Status: AC

## 2018-03-28 MED FILL — Succinylcholine Chloride Sol Pref Syr 200 MG/10ML (20 MG/ML): INTRAVENOUS | Qty: 10 | Status: AC

## 2018-03-28 MED FILL — Ondansetron HCl Inj 4 MG/2ML (2 MG/ML): INTRAMUSCULAR | Qty: 2 | Status: AC

## 2018-03-28 MED FILL — Dexamethasone Sodium Phosphate Inj 4 MG/ML: INTRAMUSCULAR | Qty: 1 | Status: AC

## 2018-03-28 MED FILL — Phenylephrine-NaCl IV Solution 10 MG/250ML-0.9%: INTRAVENOUS | Qty: 250 | Status: AC

## 2018-03-28 NOTE — H&P (Signed)
NOT MY H & P.  I had no relationship with the patient during this hospitalization

## 2018-04-03 NOTE — Addendum Note (Signed)
Addendum  created 04/03/18 2219 by Kipp BroodJoslin, Tashiba Timoney, MD   Intraprocedure Staff edited

## 2018-05-29 ENCOUNTER — Other Ambulatory Visit: Payer: Self-pay | Admitting: Neurosurgery

## 2018-06-20 NOTE — Pre-Procedure Instructions (Signed)
Sufian Mehring  06/20/2018      [facility] - [place], [place] - [address] AT [facility] [address] [place] [address] Phone: [phone] Fax: [phone]    Your procedure is scheduled on jULY 1  Report to [facility] Admitting at 0730 A.M.  Call this number if you have problems the morning of surgery:  [phone]   Remember:  NOTHING TO EAT OR DRINK AFTER MIDNIGHT    Take these medicines the morning of surgery with A SIP OF WATER  buPROPion (WELLBUTRIN XL) sertraline (ZOLOFT)   7 days prior to surgery STOP taking any Aspirin(unless otherwise instructed by your surgeon), Aleve, Naproxen, Ibuprofen, Motrin, Advil, Goody's, BC's, all herbal medications, fish oil, and all vitamins   WHAT DO I DO ABOUT MY DIABETES MEDICATION?   Marland Kitchen Do not take oral diabetes medicines (pills) the morning of surgery. JARDIANCE,  JANUVIA   . THE NIGHT BEFORE SURGERY, take _____15______ units of __Insulin Glargine (BASAGLAR KWIKPEN)_________insulin.        How to Manage Your Diabetes Before and After Surgery  Why is it important to control my blood sugar before and after surgery? . Improving blood sugar levels before and after surgery helps healing and can limit problems. . A way of improving blood sugar control is eating a healthy diet by: o  Eating less sugar and carbohydrates o  Increasing activity/exercise o  Talking with your doctor about reaching your blood sugar goals . High blood sugars (greater than 180 mg/dL) can raise your risk of infections and slow your recovery, so you will need to focus on controlling your diabetes during the weeks before surgery. . Make sure that the doctor who takes care of your diabetes knows about your planned surgery including the date and location.  How do I manage my blood sugar before surgery? . Check your blood sugar at least 4 times a day, starting 2 days before surgery, to make sure that the  level is not too high or low. o Check your blood sugar the morning of your surgery when you wake up and every 2 hours until you get to the Short Stay unit. . If your blood sugar is less than 70 mg/dL, you will need to treat for low blood sugar: o Do not take insulin. o Treat a low blood sugar (less than 70 mg/dL) with  cup of clear juice (cranberry or apple), 4 glucose tablets, OR glucose gel. o Recheck blood sugar in 15 minutes after treatment (to make sure it is greater than 70 mg/dL). If your blood sugar is not greater than 70 mg/dL on recheck, call [phone] for further instructions. . Report your blood sugar to the short stay nurse when you get to Short Stay.  . If you are admitted to the hospital after surgery: o Your blood sugar will be checked by the staff and you will probably be given insulin after surgery (instead of oral diabetes medicines) to make sure you have good blood sugar levels. o The goal for blood sugar control after surgery is 80-180 mg/dL.     Do not wear jewelry  Do not wear lotions, powders, or cologne, or deodorant.  Men may shave face and neck.  Do not bring valuables to the hospital.  [facility] is not responsible for any belongings or valuables.  Contacts, dentures or bridgework may not be worn into surgery.  Leave your  suitcase in the car.  After surgery it may be brought to your room.  For patients admitted to the hospital, discharge time will be determined by your treatment team.  Patients discharged the day of surgery will not be allowed to drive home.    Special instructions:   [facility]- Preparing For Surgery  Before surgery, you can play an important role. Because skin is not sterile, your skin needs to be as free of germs as possible. You can reduce the number of germs on your skin by washing with CHG (chlorahexidine gluconate) Soap before surgery.  CHG is an antiseptic cleaner which kills germs and bonds with the skin to continue killing  germs even after washing.    Oral Hygiene is also important to reduce your risk of infection.  Remember - BRUSH YOUR TEETH THE MORNING OF SURGERY WITH YOUR REGULAR TOOTHPASTE  Please do not use if you have an allergy to CHG or antibacterial soaps. If your skin becomes reddened/irritated stop using the CHG.  Do not shave (including legs and underarms) for at least 48 hours prior to first CHG shower. It is OK to shave your face.  Please follow these instructions carefully.   1. Shower the NIGHT BEFORE SURGERY and the MORNING OF SURGERY with CHG.   2. If you chose to wash your hair, wash your hair first as usual with your normal shampoo.  3. After you shampoo, rinse your hair and body thoroughly to remove the shampoo.  4. Use CHG as you would any other liquid soap. You can apply CHG directly to the skin and wash gently with a scrungie or a clean washcloth.   5. Apply the CHG Soap to your body ONLY FROM THE NECK DOWN.  Do not use on open wounds or open sores. Avoid contact with your eyes, ears, mouth and genitals (private parts). Wash Face and genitals (private parts)  with your normal soap.  6. Wash thoroughly, paying special attention to the area where your surgery will be performed.  7. Thoroughly rinse your body with warm water from the neck down.  8. DO NOT shower/wash with your normal soap after using and rinsing off the CHG Soap.  9. Pat yourself dry with a CLEAN TOWEL.  10. Wear CLEAN PAJAMAS to bed the night before surgery, wear comfortable clothes the morning of surgery  11. Place CLEAN SHEETS on your bed the night of your first shower and DO NOT SLEEP WITH PETS.    Day of Surgery:  Do not apply any deodorants/lotions.  Please wear clean clothes to the hospital/surgery center.   Remember to brush your teeth WITH YOUR REGULAR TOOTHPASTE.    Please read over the following fact sheets that you were given.

## 2018-06-23 NOTE — Pre-Procedure Instructions (Addendum)
Ernest Franco  06/23/2018    Your procedure is scheduled on Monday,  JULY 1  Report to [facility] Admitting at 0730 A.M. Your surgery or procedure is scheduled for 9:30 AAM  Call this number if you have problems the morning of surgery:  [phone] - pre- op desk.  Remember:  NOTHING TO EAT OR DRINK AFTER MIDNIGHT    Take these medicines the morning of surgery with A SIP OF WATER  buPROPion (WELLBUTRIN XL) sertraline (ZOLOFT)  Prilosec  7 days prior to surgery STOP taking any Aspirin(unless otherwise instructed by your surgeon), Aleve, Naproxen, Ibuprofen, Motrin, Advil, Goody's, BC's, all herbal medications, fish oil, and all vitamins   WHAT DO I DO ABOUT MY DIABETES MEDICATION?   Marland Kitchen. Do not take oral diabetes medicines (pills) the morning of surgery. JARDIANCE,  JANUVIA   . THE NIGHT BEFORE SURGERY, take _____15______ units of __Insulin Glargine (BASAGLAR KWIKPEN)_________insulin.     How to Manage Your Diabetes Before and After Surgery  Why is it important to control my blood sugar before and after surgery? . Improving blood sugar levels before and after surgery helps healing and can limit problems. . A way of improving blood sugar control is eating a healthy diet by: o  Eating less sugar and carbohydrates o  Increasing activity/exercise o  Talking with your doctor about reaching your blood sugar goals . High blood sugars (greater than 180 mg/dL) can raise your risk of infections and slow your recovery, so you will need to focus on controlling your diabetes during the weeks before surgery. . Make sure that the doctor who takes care of your diabetes knows about your planned surgery including the date and location.  How do I manage my blood sugar before surgery? . Check your blood sugar at least 4 times a day, starting 2 days before surgery, to make sure that the level is not too high or low. o Check your blood sugar the morning of your surgery when you wake up  and every 2 hours until you get to the Short Stay unit. . If your blood sugar is less than 70 mg/dL, you will need to treat for low blood sugar: o Do not take insulin. o Treat a low blood sugar (less than 70 mg/dL) with  cup of clear juice (cranberry or apple), 4 glucose tablets, OR glucose gel. o Recheck blood sugar in 15 minutes after treatment (to make sure it is greater than 70 mg/dL). If your blood sugar is not greater than 70 mg/dL on recheck, call [phone] for further instructions. . Report your blood sugar to the short stay nurse when you get to Short Stay.  . If you are admitted to the hospital after surgery: o Your blood sugar will be checked by the staff and you will probably be given insulin after surgery (instead of oral diabetes medicines) to make sure you have good blood sugar levels. o The goal for blood sugar control after surgery is 80-180 mg/dL.  Special instructions:   [facility]- Preparing For Surgery  Before surgery, you can play an important role. Because skin is not sterile, your skin needs to be as free of germs as possible. You can reduce the number of germs on your skin by washing with CHG (chlorahexidine gluconate) Soap before surgery.  CHG is an antiseptic cleaner which kills germs and bonds with the skin to continue killing germs even after washing.    Oral Hygiene is also important  to reduce your risk of infection.  Remember - BRUSH YOUR TEETH THE MORNING OF SURGERY WITH YOUR REGULAR TOOTHPASTE  Please do not use if you have an allergy to CHG or antibacterial soaps. If your skin becomes reddened/irritated stop using the CHG.  Do not shave (including legs and underarms) for at least 48 hours prior to first CHG shower. It is OK to shave your face.  Please follow these instructions carefully.   1. Shower the NIGHT BEFORE SURGERY and the MORNING OF SURGERY with CHG.   2. If you chose to wash your hair, wash your hair first as usual with your normal  shampoo.  3. After you shampoo, rinse your hair and body thoroughly to remove the shampoo. Wash your face and private area with the soap you use at home, then rinse.  4. Use CHG as you would any other liquid soap. You can apply CHG directly to the skin and wash gently with a scrungie or a clean washcloth.   5. Apply the CHG Soap to your body ONLY FROM THE NECK DOWN.  Do not use on open wounds or open sores. Avoid contact with your eyes, ears, mouth and genitals (private parts).   6. Wash thoroughly, paying special attention to the area where your surgery will be performed.  7. Thoroughly rinse your body with warm water from the neck down.  8. DO NOT shower/wash with your normal soap after using and rinsing off the CHG Soap.  9. Pat yourself dry with a CLEAN TOWEL.  10. Wear CLEAN PAJAMAS to bed the night before surgery, wear comfortable clothes the morning of surgery  11. Place CLEAN SHEETS on your bed the night of your first shower and DO NOT SLEEP WITH PETS. Day of Surgery: Shower as above Do not apply any deodorants/lotions.  Please wear clean clothes to the hospital/surgery center.   Remember to brush your teeth WITH YOUR REGULAR TOOTHPASTE. Do not wear jewelry  Do not wear lotions, powders, or cologne, or deodorant.  Men may shave face and neck.  Do not bring valuables to the hospital.  [facility] is not responsible for any belongings or valuables.  Contacts, dentures or bridgework may not be worn into surgery.  Leave your suitcase in the car.  After surgery it may be brought to your room.  For patients admitted to the hospital, discharge time will be determined by your treatment team.  Patients discharged the day of surgery will not be allowed to drive home.  Please read over the following fact sheets that you were given: Pain Booklet, Patient Instructions for Mupirocin Application,Coughing and Deep Breathing , Surgical Site Infections.

## 2018-06-24 ENCOUNTER — Encounter (HOSPITAL_COMMUNITY)
Admission: RE | Admit: 2018-06-24 | Discharge: 2018-06-24 | Disposition: A | Discharge status: Home / Self Care | Payer: Managed Care, Other (non HMO) | Source: Ambulatory Visit | Attending: Neurosurgery | Admitting: Neurosurgery

## 2018-06-24 ENCOUNTER — Other Ambulatory Visit: Payer: Self-pay

## 2018-06-24 ENCOUNTER — Encounter (HOSPITAL_COMMUNITY): Payer: Self-pay

## 2018-06-24 DIAGNOSIS — Z01812 Encounter for preprocedural laboratory examination: Secondary | ICD-10-CM | POA: Insufficient documentation

## 2018-06-24 HISTORY — DX: Nausea with vomiting, unspecified: R11.2

## 2018-06-24 HISTORY — DX: Anxiety disorder, unspecified: F41.9

## 2018-06-24 HISTORY — DX: Type 2 diabetes mellitus without complications: E11.9

## 2018-06-24 HISTORY — DX: Nausea with vomiting, unspecified: Z98.890

## 2018-06-24 HISTORY — DX: Other complications of anesthesia, initial encounter: T88.59XA

## 2018-06-24 HISTORY — DX: Adverse effect of unspecified anesthetic, initial encounter: T41.45XA

## 2018-06-24 LAB — CBC WITH DIFFERENTIAL/PLATELET
ABS IMMATURE GRANULOCYTES: 0.1 10*3/uL (ref 0.0–0.1)
Basophils Absolute: 0.1 10*3/uL (ref 0.0–0.1)
Basophils Relative: 1 %
EOS ABS: 0.1 10*3/uL (ref 0.0–0.7)
Eosinophils Relative: 1 %
HEMATOCRIT: 49.9 % (ref 39.0–52.0)
HEMOGLOBIN: 16.9 g/dL (ref 13.0–17.0)
Immature Granulocytes: 1 %
LYMPHS ABS: 2.1 10*3/uL (ref 0.7–4.0)
LYMPHS PCT: 27 %
MCH: 29.7 pg (ref 26.0–34.0)
MCHC: 33.9 g/dL (ref 30.0–36.0)
MCV: 87.7 fL (ref 78.0–100.0)
MONOS PCT: 12 %
Monocytes Absolute: 0.9 10*3/uL (ref 0.1–1.0)
Neutro Abs: 4.5 10*3/uL (ref 1.7–7.7)
Neutrophils Relative %: 58 %
Platelets: 224 10*3/uL (ref 150–400)
RBC: 5.69 MIL/uL (ref 4.22–5.81)
RDW: 13.3 % (ref 11.5–15.5)
WBC: 7.8 10*3/uL (ref 4.0–10.5)

## 2018-06-24 LAB — BASIC METABOLIC PANEL
Anion gap: 9 (ref 5–15)
BUN: 18 mg/dL (ref 6–20)
CHLORIDE: 107 mmol/L (ref 98–111)
CO2: 24 mmol/L (ref 22–32)
CREATININE: 1.16 mg/dL (ref 0.61–1.24)
Calcium: 9.4 mg/dL (ref 8.9–10.3)
GFR calc Af Amer: >60 mL/min (ref >60)
GFR calc non Af Amer: >60 mL/min (ref >60)
Glucose, Bld: 132 mg/dL — ABNORMAL HIGH (ref 70–99)
POTASSIUM: 3.7 mmol/L (ref 3.5–5.1)
SODIUM: 140 mmol/L (ref 135–145)

## 2018-06-24 LAB — GLUCOSE, CAPILLARY: Glucose-Capillary: 135 mg/dL — ABNORMAL HIGH (ref 70–99)

## 2018-06-24 LAB — SURGICAL PCR SCREEN
MRSA, PCR: NEGATIVE
Staphylococcus aureus: POSITIVE — AB

## 2018-06-24 NOTE — Progress Notes (Signed)
Mr Ernest Franco denies chest pain or shortness of breath. He reports that he has never seen a cardiologist. Patient is a Type II DM, he is managed by PCP Dr. Noland FordyceA Hawks, [facility]. Patient reports that CBG's run 106 - 129.  Last A1C was 7.2, drawn 05/28/18 at PCP's office.

## 2018-06-27 MED ORDER — DEXTROSE 5 % IV SOLN
3.0000 g | INTRAVENOUS | Status: AC
  Administered 2018-06-30: 3 g via INTRAVENOUS
  Filled 2018-06-27: qty 3

## 2018-06-30 ENCOUNTER — Other Ambulatory Visit: Payer: Self-pay

## 2018-06-30 ENCOUNTER — Encounter (HOSPITAL_COMMUNITY): Payer: Self-pay | Admitting: Surgery

## 2018-06-30 ENCOUNTER — Encounter (HOSPITAL_COMMUNITY)
Admission: RE | Disposition: A | Discharge status: Home / Self Care | Payer: Self-pay | Source: Ambulatory Visit | Attending: Neurosurgery

## 2018-06-30 ENCOUNTER — Ambulatory Visit (HOSPITAL_COMMUNITY): Payer: No Typology Code available for payment source | Admitting: Anesthesiology

## 2018-06-30 ENCOUNTER — Ambulatory Visit (HOSPITAL_COMMUNITY): Payer: No Typology Code available for payment source

## 2018-06-30 ENCOUNTER — Observation Stay (HOSPITAL_COMMUNITY)
Admission: RE | Admit: 2018-06-30 | Discharge: 2018-07-01 | Disposition: A | Discharge status: Home / Self Care | Payer: No Typology Code available for payment source | Source: Ambulatory Visit | Attending: Neurosurgery | Admitting: Neurosurgery

## 2018-06-30 DIAGNOSIS — F329 Major depressive disorder, single episode, unspecified: Secondary | ICD-10-CM | POA: Diagnosis not present

## 2018-06-30 DIAGNOSIS — K219 Gastro-esophageal reflux disease without esophagitis: Secondary | ICD-10-CM | POA: Diagnosis not present

## 2018-06-30 DIAGNOSIS — Z419 Encounter for procedure for purposes other than remedying health state, unspecified: Secondary | ICD-10-CM

## 2018-06-30 DIAGNOSIS — E119 Type 2 diabetes mellitus without complications: Secondary | ICD-10-CM | POA: Insufficient documentation

## 2018-06-30 DIAGNOSIS — M549 Dorsalgia, unspecified: Secondary | ICD-10-CM | POA: Diagnosis present

## 2018-06-30 DIAGNOSIS — Z6841 Body Mass Index (BMI) 40.0 and over, adult: Secondary | ICD-10-CM | POA: Insufficient documentation

## 2018-06-30 DIAGNOSIS — Z794 Long term (current) use of insulin: Secondary | ICD-10-CM | POA: Diagnosis not present

## 2018-06-30 DIAGNOSIS — I10 Essential (primary) hypertension: Secondary | ICD-10-CM | POA: Diagnosis not present

## 2018-06-30 DIAGNOSIS — Z79899 Other long term (current) drug therapy: Secondary | ICD-10-CM | POA: Diagnosis not present

## 2018-06-30 DIAGNOSIS — M5126 Other intervertebral disc displacement, lumbar region: Secondary | ICD-10-CM | POA: Diagnosis present

## 2018-06-30 DIAGNOSIS — G4730 Sleep apnea, unspecified: Secondary | ICD-10-CM | POA: Insufficient documentation

## 2018-06-30 DIAGNOSIS — M5104 Intervertebral disc disorders with myelopathy, thoracic region: Secondary | ICD-10-CM | POA: Diagnosis not present

## 2018-06-30 HISTORY — PX: THORACIC DISCECTOMY: SHX6113

## 2018-06-30 LAB — GLUCOSE, CAPILLARY
GLUCOSE-CAPILLARY: 157 mg/dL — AB (ref 70–99)
Glucose-Capillary: 153 mg/dL — ABNORMAL HIGH (ref 70–99)
Glucose-Capillary: 162 mg/dL — ABNORMAL HIGH (ref 70–99)
Glucose-Capillary: 230 mg/dL — ABNORMAL HIGH (ref 70–99)

## 2018-06-30 SURGERY — THORACIC DISCECTOMY
Anesthesia: General | Site: Back | Laterality: Right

## 2018-06-30 MED ORDER — DEXAMETHASONE SODIUM PHOSPHATE 10 MG/ML IJ SOLN
10.0000 mg | INTRAMUSCULAR | Status: AC
  Administered 2018-06-30: 5 mg via INTRAVENOUS
  Filled 2018-06-30: qty 1

## 2018-06-30 MED ORDER — PROMETHAZINE HCL 25 MG/ML IJ SOLN: 6.2500 mg | INTRAMUSCULAR | Status: DC | PRN

## 2018-06-30 MED ORDER — SODIUM CHLORIDE 0.9% FLUSH: 3.0000 mL | Freq: Two times a day (BID) | INTRAVENOUS | Status: DC

## 2018-06-30 MED ORDER — LIDOCAINE HCL (CARDIAC) PF 100 MG/5ML IV SOSY
PREFILLED_SYRINGE | INTRAVENOUS | Status: DC | PRN
  Administered 2018-06-30: 50 mg via INTRAVENOUS

## 2018-06-30 MED ORDER — LINAGLIPTIN 5 MG PO TABS
5.0000 mg | ORAL_TABLET | Freq: Every day | ORAL | Status: DC
  Administered 2018-07-01: 5 mg via ORAL
  Filled 2018-06-30: qty 1

## 2018-06-30 MED ORDER — 0.9 % SODIUM CHLORIDE (POUR BTL) OPTIME
TOPICAL | Status: DC | PRN
  Administered 2018-06-30: 1000 mL

## 2018-06-30 MED ORDER — ROCURONIUM BROMIDE 100 MG/10ML IV SOLN
INTRAVENOUS | Status: DC | PRN
  Administered 2018-06-30: 50 mg via INTRAVENOUS

## 2018-06-30 MED ORDER — FENTANYL CITRATE (PF) 100 MCG/2ML IJ SOLN
INTRAMUSCULAR | Status: DC | PRN
  Administered 2018-06-30: 100 ug via INTRAVENOUS
  Administered 2018-06-30: 150 ug via INTRAVENOUS

## 2018-06-30 MED ORDER — CEFAZOLIN SODIUM-DEXTROSE 1-4 GM/50ML-% IV SOLN
1.0000 g | Freq: Three times a day (TID) | INTRAVENOUS | Status: AC
  Administered 2018-06-30 (×2): 1 g via INTRAVENOUS
  Filled 2018-06-30 (×2): qty 50

## 2018-06-30 MED ORDER — HYDROMORPHONE HCL 1 MG/ML IJ SOLN
INTRAMUSCULAR | Status: AC
  Filled 2018-06-30: qty 1

## 2018-06-30 MED ORDER — LIDOCAINE 2% (20 MG/ML) 5 ML SYRINGE
INTRAMUSCULAR | Status: AC
  Filled 2018-06-30: qty 5

## 2018-06-30 MED ORDER — FUROSEMIDE 40 MG PO TABS
40.0000 mg | ORAL_TABLET | Freq: Every day | ORAL | Status: DC
  Administered 2018-06-30 – 2018-07-01 (×2): 40 mg via ORAL
  Filled 2018-06-30 (×2): qty 1

## 2018-06-30 MED ORDER — BUPIVACAINE HCL (PF) 0.25 % IJ SOLN
INTRAMUSCULAR | Status: DC | PRN
  Administered 2018-06-30: 20 mL

## 2018-06-30 MED ORDER — BUPIVACAINE HCL (PF) 0.25 % IJ SOLN
INTRAMUSCULAR | Status: AC
  Filled 2018-06-30: qty 30

## 2018-06-30 MED ORDER — BUPROPION HCL ER (XL) 300 MG PO TB24
300.0000 mg | ORAL_TABLET | Freq: Every day | ORAL | Status: DC
  Administered 2018-06-30 – 2018-07-01 (×2): 300 mg via ORAL
  Filled 2018-06-30 (×2): qty 1

## 2018-06-30 MED ORDER — MENTHOL 3 MG MT LOZG: 1.0000 | LOZENGE | OROMUCOSAL | Status: DC | PRN

## 2018-06-30 MED ORDER — THROMBIN 5000 UNITS EX SOLR
CUTANEOUS | Status: AC
  Filled 2018-06-30: qty 10000

## 2018-06-30 MED ORDER — CANAGLIFLOZIN 100 MG PO TABS
100.0000 mg | ORAL_TABLET | Freq: Every day | ORAL | Status: DC
  Administered 2018-07-01: 100 mg via ORAL
  Filled 2018-06-30: qty 1

## 2018-06-30 MED ORDER — SUGAMMADEX SODIUM 500 MG/5ML IV SOLN
INTRAVENOUS | Status: AC
  Filled 2018-06-30: qty 5

## 2018-06-30 MED ORDER — SUCCINYLCHOLINE CHLORIDE 200 MG/10ML IV SOSY
PREFILLED_SYRINGE | INTRAVENOUS | Status: AC
  Filled 2018-06-30: qty 10

## 2018-06-30 MED ORDER — FENTANYL CITRATE (PF) 250 MCG/5ML IJ SOLN
INTRAMUSCULAR | Status: AC
  Filled 2018-06-30: qty 5

## 2018-06-30 MED ORDER — HEMOSTATIC AGENTS (NO CHARGE) OPTIME
TOPICAL | Status: DC | PRN
  Administered 2018-06-30: 1 via TOPICAL

## 2018-06-30 MED ORDER — SODIUM CHLORIDE 0.9 % IV SOLN: 250.0000 mL | INTRAVENOUS | Status: DC

## 2018-06-30 MED ORDER — KETOROLAC TROMETHAMINE 30 MG/ML IJ SOLN
INTRAMUSCULAR | Status: DC | PRN
  Administered 2018-06-30: 30 mg via INTRAVENOUS

## 2018-06-30 MED ORDER — INSULIN GLARGINE 100 UNIT/ML ~~LOC~~ SOLN
30.0000 [IU] | Freq: Every day | SUBCUTANEOUS | Status: DC
  Administered 2018-06-30: 30 [IU] via SUBCUTANEOUS
  Filled 2018-06-30: qty 0.3

## 2018-06-30 MED ORDER — CHLORHEXIDINE GLUCONATE CLOTH 2 % EX PADS: 6.0000 | MEDICATED_PAD | Freq: Once | CUTANEOUS | Status: DC

## 2018-06-30 MED ORDER — THROMBIN 5000 UNITS EX SOLR
CUTANEOUS | Status: AC
  Filled 2018-06-30: qty 5000

## 2018-06-30 MED ORDER — SODIUM CHLORIDE 0.9 % IV SOLN
INTRAVENOUS | Status: DC | PRN
  Administered 2018-06-30: 500 mL

## 2018-06-30 MED ORDER — HYDROMORPHONE HCL 1 MG/ML IJ SOLN
0.2500 mg | INTRAMUSCULAR | Status: DC | PRN
  Administered 2018-06-30 (×4): 0.5 mg via INTRAVENOUS

## 2018-06-30 MED ORDER — PROPOFOL 10 MG/ML IV BOLUS
INTRAVENOUS | Status: AC
  Filled 2018-06-30: qty 20

## 2018-06-30 MED ORDER — KETOROLAC TROMETHAMINE 30 MG/ML IJ SOLN
INTRAMUSCULAR | Status: AC
  Filled 2018-06-30: qty 1

## 2018-06-30 MED ORDER — ACETAMINOPHEN 650 MG RE SUPP: 650.0000 mg | RECTAL | Status: DC | PRN

## 2018-06-30 MED ORDER — LACTATED RINGERS IV SOLN
INTRAVENOUS | Status: DC | PRN
  Administered 2018-06-30 (×2): via INTRAVENOUS

## 2018-06-30 MED ORDER — ONDANSETRON HCL 4 MG/2ML IJ SOLN
INTRAMUSCULAR | Status: DC | PRN
  Administered 2018-06-30: 4 mg via INTRAVENOUS

## 2018-06-30 MED ORDER — OMEPRAZOLE MAGNESIUM 20 MG PO TBEC: 40.0000 mg | DELAYED_RELEASE_TABLET | Freq: Every day | ORAL | Status: DC

## 2018-06-30 MED ORDER — ONDANSETRON HCL 4 MG PO TABS: 4.0000 mg | ORAL_TABLET | Freq: Four times a day (QID) | ORAL | Status: DC | PRN

## 2018-06-30 MED ORDER — DEXAMETHASONE SODIUM PHOSPHATE 10 MG/ML IJ SOLN
INTRAMUSCULAR | Status: AC
  Filled 2018-06-30: qty 1

## 2018-06-30 MED ORDER — SUGAMMADEX SODIUM 200 MG/2ML IV SOLN
INTRAVENOUS | Status: DC | PRN
  Administered 2018-06-30: 400 mg via INTRAVENOUS

## 2018-06-30 MED ORDER — PANTOPRAZOLE SODIUM 40 MG PO TBEC
40.0000 mg | DELAYED_RELEASE_TABLET | Freq: Every day | ORAL | Status: DC
  Administered 2018-07-01: 40 mg via ORAL
  Filled 2018-06-30: qty 1

## 2018-06-30 MED ORDER — LACTATED RINGERS IV SOLN: INTRAVENOUS | Status: DC

## 2018-06-30 MED ORDER — INSULIN ASPART 100 UNIT/ML ~~LOC~~ SOLN
0.0000 [IU] | Freq: Three times a day (TID) | SUBCUTANEOUS | Status: DC
  Administered 2018-06-30 – 2018-07-01 (×2): 4 [IU] via SUBCUTANEOUS

## 2018-06-30 MED ORDER — ROCURONIUM BROMIDE 10 MG/ML (PF) SYRINGE
PREFILLED_SYRINGE | INTRAVENOUS | Status: AC
  Filled 2018-06-30: qty 10

## 2018-06-30 MED ORDER — ONDANSETRON HCL 4 MG/2ML IJ SOLN: 4.0000 mg | Freq: Four times a day (QID) | INTRAMUSCULAR | Status: DC | PRN

## 2018-06-30 MED ORDER — SUCCINYLCHOLINE CHLORIDE 20 MG/ML IJ SOLN
INTRAMUSCULAR | Status: DC | PRN
  Administered 2018-06-30: 160 mg via INTRAVENOUS

## 2018-06-30 MED ORDER — THROMBIN 5000 UNITS EX SOLR
CUTANEOUS | Status: DC | PRN
  Administered 2018-06-30 (×2): 5000 [IU] via TOPICAL

## 2018-06-30 MED ORDER — SCOPOLAMINE 1 MG/3DAYS TD PT72
1.0000 | MEDICATED_PATCH | TRANSDERMAL | Status: DC
  Administered 2018-06-30: 1.5 mg via TRANSDERMAL
  Filled 2018-06-30: qty 1

## 2018-06-30 MED ORDER — BASAGLAR KWIKPEN 100 UNIT/ML ~~LOC~~ SOPN: 30.0000 [IU] | PEN_INJECTOR | Freq: Every day | SUBCUTANEOUS | Status: DC

## 2018-06-30 MED ORDER — ACETAMINOPHEN 325 MG PO TABS: 650.0000 mg | ORAL_TABLET | ORAL | Status: DC | PRN

## 2018-06-30 MED ORDER — SODIUM CHLORIDE 0.9% FLUSH: 3.0000 mL | INTRAVENOUS | Status: DC | PRN

## 2018-06-30 MED ORDER — CYCLOBENZAPRINE HCL 10 MG PO TABS
ORAL_TABLET | ORAL | Status: AC
  Filled 2018-06-30: qty 1

## 2018-06-30 MED ORDER — HYDROCODONE-ACETAMINOPHEN 10-325 MG PO TABS
2.0000 | ORAL_TABLET | ORAL | Status: DC | PRN
  Administered 2018-06-30 – 2018-07-01 (×4): 2 via ORAL
  Filled 2018-06-30 (×4): qty 2

## 2018-06-30 MED ORDER — KETOROLAC TROMETHAMINE 15 MG/ML IJ SOLN
30.0000 mg | Freq: Four times a day (QID) | INTRAMUSCULAR | Status: AC
  Administered 2018-06-30 – 2018-07-01 (×4): 30 mg via INTRAVENOUS
  Filled 2018-06-30 (×4): qty 2

## 2018-06-30 MED ORDER — PROPOFOL 10 MG/ML IV BOLUS
INTRAVENOUS | Status: DC | PRN
  Administered 2018-06-30: 200 mg via INTRAVENOUS

## 2018-06-30 MED ORDER — HYDROMORPHONE HCL 1 MG/ML IJ SOLN
1.0000 mg | INTRAMUSCULAR | Status: DC | PRN
  Administered 2018-06-30: 1 mg via INTRAVENOUS
  Filled 2018-06-30: qty 1

## 2018-06-30 MED ORDER — CYCLOBENZAPRINE HCL 10 MG PO TABS
10.0000 mg | ORAL_TABLET | Freq: Three times a day (TID) | ORAL | Status: DC | PRN
  Administered 2018-06-30 – 2018-07-01 (×3): 10 mg via ORAL
  Filled 2018-06-30 (×2): qty 1

## 2018-06-30 MED ORDER — PHENOL 1.4 % MT LIQD: 1.0000 | OROMUCOSAL | Status: DC | PRN

## 2018-06-30 MED ORDER — PHENYLEPHRINE 40 MCG/ML (10ML) SYRINGE FOR IV PUSH (FOR BLOOD PRESSURE SUPPORT)
PREFILLED_SYRINGE | INTRAVENOUS | Status: AC
  Filled 2018-06-30: qty 10

## 2018-06-30 MED ORDER — MIDAZOLAM HCL 2 MG/2ML IJ SOLN
INTRAMUSCULAR | Status: AC
  Filled 2018-06-30: qty 2

## 2018-06-30 MED ORDER — ONDANSETRON HCL 4 MG/2ML IJ SOLN
INTRAMUSCULAR | Status: AC
  Filled 2018-06-30: qty 2

## 2018-06-30 MED ORDER — SERTRALINE HCL 50 MG PO TABS
50.0000 mg | ORAL_TABLET | Freq: Every day | ORAL | Status: DC
  Administered 2018-06-30 – 2018-07-01 (×2): 50 mg via ORAL
  Filled 2018-06-30 (×2): qty 1

## 2018-06-30 MED ORDER — HYDROCODONE-ACETAMINOPHEN 5-325 MG PO TABS: 1.0000 | ORAL_TABLET | ORAL | Status: DC | PRN

## 2018-06-30 MED ORDER — THROMBIN 5000 UNITS EX SOLR
CUTANEOUS | Status: DC | PRN
  Administered 2018-06-30: 5 mL via TOPICAL

## 2018-06-30 SURGICAL SUPPLY — 59 items
BAG DECANTER FOR FLEXI CONT (MISCELLANEOUS) ×3 IMPLANT
BENZOIN TINCTURE PRP APPL 2/3 (GAUZE/BANDAGES/DRESSINGS) ×3 IMPLANT
BUR CUTTER 7.0 ROUND (BURR) ×3 IMPLANT
BUR MATCHSTICK NEURO 3.0 LAGG (BURR) ×3 IMPLANT
CANISTER SUCT 3000ML PPV (MISCELLANEOUS) ×3 IMPLANT
CARTRIDGE OIL MAESTRO DRILL (MISCELLANEOUS) ×1 IMPLANT
CLOSURE WOUND 1/2 X4 (GAUZE/BANDAGES/DRESSINGS) ×1
DECANTER SPIKE VIAL GLASS SM (MISCELLANEOUS) ×3 IMPLANT
DERMABOND ADVANCED (GAUZE/BANDAGES/DRESSINGS) ×2
DERMABOND ADVANCED .7 DNX12 (GAUZE/BANDAGES/DRESSINGS) ×1 IMPLANT
DIFFUSER DRILL AIR PNEUMATIC (MISCELLANEOUS) ×3 IMPLANT
DRAPE HALF SHEET 40X57 (DRAPES) ×3 IMPLANT
DRAPE LAPAROTOMY 100X72X124 (DRAPES) ×3 IMPLANT
DRAPE MICROSCOPE LEICA (MISCELLANEOUS) ×3 IMPLANT
DRAPE POUCH INSTRU U-SHP 10X18 (DRAPES) ×3 IMPLANT
DRAPE SURG 17X23 STRL (DRAPES) ×12 IMPLANT
DRSG OPSITE POSTOP 4X6 (GAUZE/BANDAGES/DRESSINGS) ×3 IMPLANT
ELECT BLADE 4.0 EZ CLEAN MEGAD (MISCELLANEOUS) ×3
ELECT REM PT RETURN 9FT ADLT (ELECTROSURGICAL) ×3
ELECTRODE BLDE 4.0 EZ CLN MEGD (MISCELLANEOUS) ×1 IMPLANT
ELECTRODE REM PT RTRN 9FT ADLT (ELECTROSURGICAL) ×1 IMPLANT
GAUZE SPONGE 4X4 12PLY STRL (GAUZE/BANDAGES/DRESSINGS) IMPLANT
GAUZE SPONGE 4X4 16PLY XRAY LF (GAUZE/BANDAGES/DRESSINGS) IMPLANT
GLOVE BIO SURGEON STRL SZ 6.5 (GLOVE) ×2 IMPLANT
GLOVE BIO SURGEONS STRL SZ 6.5 (GLOVE) ×1
GLOVE BIOGEL PI IND STRL 6.5 (GLOVE) ×2 IMPLANT
GLOVE BIOGEL PI IND STRL 7.0 (GLOVE) ×2 IMPLANT
GLOVE BIOGEL PI INDICATOR 6.5 (GLOVE) ×4
GLOVE BIOGEL PI INDICATOR 7.0 (GLOVE) ×4
GLOVE ECLIPSE 9.0 STRL (GLOVE) ×3 IMPLANT
GLOVE EXAM NITRILE LRG STRL (GLOVE) IMPLANT
GLOVE EXAM NITRILE XL STR (GLOVE) IMPLANT
GLOVE EXAM NITRILE XS STR PU (GLOVE) IMPLANT
GLOVE SURG SS PI 6.5 STRL IVOR (GLOVE) ×3 IMPLANT
GLOVE SURG SS PI 7.5 STRL IVOR (GLOVE) ×12 IMPLANT
GOWN STRL REUS W/ TWL LRG LVL3 (GOWN DISPOSABLE) ×2 IMPLANT
GOWN STRL REUS W/ TWL XL LVL3 (GOWN DISPOSABLE) ×2 IMPLANT
GOWN STRL REUS W/TWL 2XL LVL3 (GOWN DISPOSABLE) IMPLANT
GOWN STRL REUS W/TWL LRG LVL3 (GOWN DISPOSABLE) ×4
GOWN STRL REUS W/TWL XL LVL3 (GOWN DISPOSABLE) ×4
HEMOSTAT POWDER SURGIFOAM 1G (HEMOSTASIS) ×3 IMPLANT
IV CATH AUTO 14GX1.75 SAFE ORG (IV SOLUTION) ×3 IMPLANT
KIT BASIN OR (CUSTOM PROCEDURE TRAY) ×3 IMPLANT
KIT TURNOVER KIT B (KITS) ×3 IMPLANT
NEEDLE HYPO 22GX1.5 SAFETY (NEEDLE) ×3 IMPLANT
NEEDLE SPNL 22GX3.5 QUINCKE BK (NEEDLE) ×3 IMPLANT
NS IRRIG 1000ML POUR BTL (IV SOLUTION) ×3 IMPLANT
OIL CARTRIDGE MAESTRO DRILL (MISCELLANEOUS) ×3
PACK LAMINECTOMY NEURO (CUSTOM PROCEDURE TRAY) ×3 IMPLANT
PAD ARMBOARD 7.5X6 YLW CONV (MISCELLANEOUS) ×9 IMPLANT
RUBBERBAND STERILE (MISCELLANEOUS) ×6 IMPLANT
SPONGE SURGIFOAM ABS GEL SZ50 (HEMOSTASIS) ×3 IMPLANT
STRIP CLOSURE SKIN 1/2X4 (GAUZE/BANDAGES/DRESSINGS) ×2 IMPLANT
SUT VIC AB 2-0 CT1 18 (SUTURE) ×3 IMPLANT
SUT VIC AB 3-0 SH 8-18 (SUTURE) ×3 IMPLANT
SYR 20CC LL (SYRINGE) ×3 IMPLANT
TOWEL GREEN STERILE (TOWEL DISPOSABLE) ×3 IMPLANT
TOWEL GREEN STERILE FF (TOWEL DISPOSABLE) ×3 IMPLANT
WATER STERILE IRR 1000ML POUR (IV SOLUTION) ×3 IMPLANT

## 2018-06-30 NOTE — Anesthesia Preprocedure Evaluation (Addendum)
Anesthesia Evaluation  Patient identified by MRN, date of birth, ID band Patient awake    Reviewed: Allergy & Precautions, NPO status , Patient's Chart, lab work & pertinent test results  History of Anesthesia Complications (+) PONV and history of anesthetic complications  Airway Mallampati: III  TM Distance: >3 FB Neck ROM: Full    Dental no notable dental hx. (+) Dental Advisory Given   Pulmonary sleep apnea ,    Pulmonary exam normal        Cardiovascular hypertension, Normal cardiovascular exam     Neuro/Psych  Headaches, PSYCHIATRIC DISORDERS Anxiety Depression    GI/Hepatic Neg liver ROS, GERD  Medicated,  Endo/Other  diabetesMorbid obesity  Renal/GU negative Renal ROS     Musculoskeletal negative musculoskeletal ROS (+)   Abdominal   Peds  Hematology negative hematology ROS (+)   Anesthesia Other Findings   Reproductive/Obstetrics                            Anesthesia Physical Anesthesia Plan  ASA: III  Anesthesia Plan: General   Post-op Pain Management:    Induction: Intravenous  PONV Risk Score and Plan: 3 and Ondansetron, Dexamethasone and Scopolamine patch - Pre-op  Airway Management Planned: Oral ETT  Additional Equipment:   Intra-op Plan:   Post-operative Plan: Extubation in OR  Informed Consent: I have reviewed the patients History and Physical, chart, labs and discussed the procedure including the risks, benefits and alternatives for the proposed anesthesia with the patient or authorized representative who has indicated his/her understanding and acceptance.   Dental advisory given  Plan Discussed with: CRNA and Anesthesiologist  Anesthesia Plan Comments:        Anesthesia Quick Evaluation

## 2018-06-30 NOTE — Anesthesia Procedure Notes (Signed)
Procedure Name: Intubation Date/Time: 06/30/2018 10:04 AM Performed by: Gwenyth AllegraAdami, Beauden Tremont, CRNA Pre-anesthesia Checklist: Patient identified, Emergency Drugs available, Suction available and Patient being monitored Patient Re-evaluated:Patient Re-evaluated prior to induction Oxygen Delivery Method: Circle system utilized Preoxygenation: Pre-oxygenation with 100% oxygen Laryngoscope Size: 4 Grade View: Grade II Tube type: Oral Tube size: 7.5 mm Number of attempts: 1 Airway Equipment and Method: Stylet Placement Confirmation: ETT inserted through vocal cords under direct vision,  positive ETCO2 and breath sounds checked- equal and bilateral Secured at: 23 cm Tube secured with: Tape Dental Injury: Teeth and Oropharynx as per pre-operative assessment

## 2018-06-30 NOTE — Transfer of Care (Signed)
Immediate Anesthesia Transfer of Care Note  Patient: Ernest Franco  Procedure(s) Performed: Microdiscectomy - right - Thoracic eleven-Thoracic twelve (Right Back)  Patient Location: PACU  Anesthesia Type:General  Level of Consciousness: awake, alert  and oriented  Airway & Oxygen Therapy: Patient Spontanous Breathing and Patient connected to nasal cannula oxygen  Post-op Assessment: Report given to RN and Post -op Vital signs reviewed and stable  Post vital signs: Reviewed and stable  Last Vitals:  Vitals Value Taken Time  BP 142/70 06/30/2018 11:54 AM  Temp    Pulse 86 06/30/2018 11:59 AM  Resp 12 06/30/2018 11:59 AM  SpO2 96 % 06/30/2018 11:59 AM  Vitals shown include unvalidated device data.  Last Pain:  Vitals:   06/30/18 1155  TempSrc:   PainSc: (P) Asleep      Patients Stated Pain Goal: 2 (06/30/18 [id number])  Complications: No apparent anesthesia complications

## 2018-06-30 NOTE — H&P (Signed)
Ernest Franco is an 47 y.o. male.   Chief Complaint: Back pain HPI: 47 year old male status post work-related injury with resultant lower thoracic/upper lumbar pain with radiating numbness tingling and pain into his right lower extremity which is failed conservative management.  Work-up demonstrates evidence of a moderately large right-sided T11-T12 disc protrusion with spinal cord compression.  Patient has failed conservative management presents now for microdiscectomy in hopes of improving his symptoms.  Past Medical History:  Diagnosis Date  . Anxiety   . Chronic back pain   . Complication of anesthesia   . Depression   . Diabetes mellitus without complication ([place])    Type II  . GERD (gastroesophageal reflux disease)   . Headache    2017- headache - hospitalized- to rule out stroke  . Morbid obesity ([place])   . PONV (postoperative nausea and vomiting)    after MRI  . Sleep apnea     Past Surgical History:  Procedure Laterality Date  . CHOLECYSTECTOMY    . COLONOSCOPY    . INCISE AND DRAIN ABCESS  01/06/2018   POST  NECK  . INCISION AND DRAINAGE ABSCESS N/A 01/06/2018   Procedure: INCISION AND DRAINAGE OF POSTERIOR NECK ABSCESS;  Surgeon: Donnie Mesa, MD;  Location: [place];  Service: General;  Laterality: N/A;  . KNEE SURGERY Right    ACL  . RADIOLOGY WITH ANESTHESIA N/A 03/27/2018   Procedure: MRI WITH THORACIC SPINE WITHOUT CONTRAST;  Surgeon: Radiologist, Medication, MD;  Location: [place];  Service: Radiology;  Laterality: N/A;  . WISDOM TOOTH EXTRACTION      Family History  Problem Relation Age of Onset  . Diabetes Mellitus II Mother    Social History:  reports that he has never smoked. He has never used smokeless tobacco. He reports that he drinks alcohol. He reports that he does not use drugs.  Allergies: No Known Allergies  Medications Prior to Admission  Medication Sig Dispense Refill  . buPROPion (WELLBUTRIN XL) 300 MG 24 hr tablet Take 300 mg by mouth daily.     . furosemide (LASIX) 20 MG tablet Take 40 mg by mouth daily.    . Insulin Glargine (BASAGLAR KWIKPEN) 100 UNIT/ML SOPN Inject 0.15 mLs (15 Units total) into the skin at bedtime. (Patient taking differently: Inject 30 Units into the skin at bedtime. ) 3 mL 1  . JANUVIA 100 MG tablet Take 100 mg by mouth daily.  2  . JARDIANCE 25 MG TABS tablet Take 25 mg by mouth daily.  1  . omeprazole (PRILOSEC OTC) 20 MG tablet Take 40 mg by mouth daily.    . sertraline (ZOLOFT) 50 MG tablet Take 50 mg by mouth daily.  11  . glipiZIDE (GLUCOTROL) 5 MG tablet Take 1 tablet (5 mg total) by mouth 2 (two) times daily with a meal. (Patient not taking: Reported on 03/20/2018) 60 tablet 0  . glucose blood (COOL BLOOD GLUCOSE TEST STRIPS) test strip Use as instructed (Patient not taking: Reported on 03/20/2018) 100 each 0  . glucose monitoring kit (FREESTYLE) monitoring kit 1 each by Does not apply route as needed for other. (Patient not taking: Reported on 03/20/2018) 1 each 0  . oxyCODONE (OXY IR/ROXICODONE) 5 MG immediate release tablet Take 1 tablet (5 mg total) by mouth every 6 (six) hours as needed for severe pain. (Patient not taking: Reported on 03/20/2018) 20 tablet 0    Results for orders placed or performed during the hospital encounter of 06/30/18 (from the past  48 hour(s))  Glucose, capillary     Status: Abnormal   Collection Time: 06/30/18  7:56 AM  Result Value Ref Range   Glucose-Capillary 153 (H) 70 - 99 mg/dL   Comment 1 Notify RN    Comment 2 Document in Chart    No results found.  Pertinent items noted in HPI and remainder of comprehensive ROS otherwise negative.  Blood pressure 139/78, pulse 72, temperature 97.7 F (36.5 C), temperature source Oral, resp. rate 20, height 5' 7"  (1.702 m), weight (!) 166 kg (366 lb), SpO2 97 %.  Patient is awake and alert.  He is oriented and appropriate.  His speech is fluent.  His judgment and insight are intact.  Cranial nerve function normal bilateral.   Motor examination with some mild weakness in his right hip flexors otherwise motor strength intact.  Sensory examination with decreased sensation in his right lower extremity.  Reflexes are hypoactive but symmetric.  No evidence of long track signs.  Gait is antalgic.  Examination head ears eyes nose throat is unremarkable her chest and abdomen are benign.  Extremities are free from injury deformity. 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  Plan right T11-T12 transpedicular microdiscectomy in hopes of improving his symptoms.  Risks and benefits of been explained.  Patient wishes to proceed.  [name] 06/30/2018, 9:40 AM

## 2018-06-30 NOTE — Brief Op Note (Signed)
06/30/2018  11:25 AM  PATIENT:  Ernest Franco  47 y.o. male  PRE-OPERATIVE DIAGNOSIS:  HNP  POST-OPERATIVE DIAGNOSIS:  HNP  PROCEDURE:  Procedure(s): Microdiscectomy - right - Thoracic eleven-Thoracic twelve (Right)  SURGEON:  Surgeon(s) and Role:    Julio Sicks* Ardith Test, MD - Primary  PHYSICIAN ASSISTANT:   ASSISTANTS: Jenkins   ANESTHESIA:   general  EBL:  100  BLOOD ADMINISTERED:none  DRAINS: none   LOCAL MEDICATIONS USED:  MARCAINE     SPECIMEN:  No Specimen  DISPOSITION OF SPECIMEN:  N/A  COUNTS:  YES  TOURNIQUET:  * No tourniquets in log *  DICTATION: .Dragon Dictation  PLAN OF CARE: Admit for overnight observation  PATIENT DISPOSITION:  PACU - hemodynamically stable.   Delay start of Pharmacological VTE agent (>24hrs) due to surgical blood loss or risk of bleeding: yes

## 2018-06-30 NOTE — Op Note (Signed)
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  Asst. Surgeon: Lovell SheehanJenkins 47 year old male status post work-related injury with resultant  Anesthesia: General  Indication: Lower thoracic/upper lumbar pain with radiating numbness paresthesias and some weakness into his right lower extremity.  Work-up demonstrates evidence for broad-based rightward T11-T12 disc herniation with compression of the lateral spinal cord.  Patient presents now for microdiscectomy in hopes of improving his symptoms.    Operative note:After induction of anesthesia, patient position prone on the Wilson frame and appropriately padded.  Lumbar and thoracic region prepped and draped sterilely.  Incision made overlying T11-12.  Dissection performed on the right side.  Retractor placed.  X-ray taken.  Level confirmed.  Laminotomy then performed using high-speed drill and Kerrison Rogers to remove the inferior aspect of the lamina of T11 the medial aspect of the T11-T12 facet joint and the superior aspect of the T12 lamina.  Ligament flavum elevated and resected.  Microscope brought in field using microdissection of the spinal canal.  Superior aspect of the pedicle of T12 was resected using high-speed drill.  Epidural venous plexus coagulated and cut.  The disc space was then explored using blunt nerve hooks and contained disc fragments within the annulus were discovered.  These were dissected free and removed.  The annulus was then incised.  The space was then entered.  Discectomy then performed removing all loose or obviously degenerative disc material from the interspace.  Epstein curettes were used to further push down disc herniation into the interspace where they could subsequently  be removed using pituitary Rogers.  At this point a very thorough decompression had been achieved.  There was no evidence of injury to the thecal sac or nerve roots.  Wound was then irrigated fanlike solution.  Gelfoam was placed topically for hemostasis which was found to be good.  Wounds and closed in layers with Vicryl sutures.  Steri-Strips and sterile dressing were applied.  No apparent complications.  Patient tolerated the procedure well and he returns to the recovery room postop.

## 2018-06-30 NOTE — Anesthesia Postprocedure Evaluation (Signed)
Anesthesia Post Note  Patient: Ernest Franco  Procedure(s) Performed: Microdiscectomy - right - Thoracic eleven-Thoracic twelve (Right Back)     Patient location during evaluation: PACU Anesthesia Type: General Level of consciousness: sedated Pain management: pain level controlled Vital Signs Assessment: post-procedure vital signs reviewed and stable Respiratory status: spontaneous breathing and respiratory function stable Cardiovascular status: stable Postop Assessment: no apparent nausea or vomiting Anesthetic complications: no    Last Vitals:  Vitals:   06/30/18 1255 06/30/18 1300  BP: 117/76   Pulse: 81 83  Resp: 16 17  Temp:    SpO2: 96% 95%    Last Pain:  Vitals:   06/30/18 1300  TempSrc:   PainSc: 5                  Amol Domanski DANIEL

## 2018-07-01 ENCOUNTER — Encounter (HOSPITAL_COMMUNITY): Payer: Self-pay | Admitting: Neurosurgery

## 2018-07-01 DIAGNOSIS — M5104 Intervertebral disc disorders with myelopathy, thoracic region: Secondary | ICD-10-CM | POA: Diagnosis not present

## 2018-07-01 LAB — GLUCOSE, CAPILLARY: Glucose-Capillary: 172 mg/dL — ABNORMAL HIGH (ref 70–99)

## 2018-07-01 MED ORDER — OXYCODONE HCL 5 MG PO TABS: 5.0000 mg | ORAL_TABLET | Freq: Four times a day (QID) | ORAL | 0 refills | Status: DC | PRN

## 2018-07-01 MED ORDER — CYCLOBENZAPRINE HCL 10 MG PO TABS: 10.0000 mg | ORAL_TABLET | Freq: Three times a day (TID) | ORAL | 0 refills | Status: DC | PRN

## 2018-07-01 NOTE — Discharge Summary (Signed)
  Physician Discharge Summary  Patient ID: KABE MCKOY MRN: [id number] DOB/AGE: 08-Nov-1971 47 y.o.  Admit date: 06/30/2018 Discharge date: 07/01/2018  Admission Diagnoses:  Discharge Diagnoses:  Active Problems:   Lumbar disc herniation   Discharged Condition: good  Hospital Course: Patient admitted to the hospital where he underwent an uncomplicated right-sided Y61-U83 microdiscectomy.  Postoperatively he is doing very well.  His preoperative back and right lower extremity pain sensory loss and weakness are resolved.  He is ambulating without difficulty.  He is ready for discharge home.  Consults:   Significant Diagnostic Studies:   Treatments:   Discharge Exam: Blood pressure 101/62, pulse 68, temperature 98.4 F (36.9 C), temperature source Oral, resp. rate 17, height '5\' 7"'$  (1.702 m), weight (!) 166 kg (366 lb), SpO2 97 %. Awake and alert.  Oriented and appropriate.  Motor and sensory function intact.  Wound clean and dry.  Chest and abdomen benign.  Disposition: Discharge disposition: 01-Home or Self Care        Allergies as of 07/01/2018   No Known Allergies     Medication List    TAKE these medications   BASAGLAR KWIKPEN 100 UNIT/ML Sopn Inject 0.15 mLs (15 Units total) into the skin at bedtime. What changed:  how much to take   buPROPion 300 MG 24 hr tablet Commonly known as:  WELLBUTRIN XL Take 300 mg by mouth daily.   cyclobenzaprine 10 MG tablet Commonly known as:  FLEXERIL Take 1 tablet (10 mg total) by mouth 3 (three) times daily as needed for muscle spasms.   furosemide 20 MG tablet Commonly known as:  LASIX Take 40 mg by mouth daily.   glipiZIDE 5 MG tablet Commonly known as:  GLUCOTROL Take 1 tablet (5 mg total) by mouth 2 (two) times daily with a meal.   glucose blood test strip Commonly known as:  COOL BLOOD GLUCOSE TEST STRIPS Use as instructed   glucose monitoring kit monitoring kit 1 each by Does not apply route as needed for  other.   JANUVIA 100 MG tablet Generic drug:  sitaGLIPtin Take 100 mg by mouth daily.   JARDIANCE 25 MG Tabs tablet Generic drug:  empagliflozin Take 25 mg by mouth daily.   omeprazole 20 MG tablet Commonly known as:  PRILOSEC OTC Take 40 mg by mouth daily.   oxyCODONE 5 MG immediate release tablet Commonly known as:  Oxy IR/ROXICODONE Take 1-2 tablets (5-10 mg total) by mouth every 6 (six) hours as needed for severe pain. What changed:  how much to take   sertraline 50 MG tablet Commonly known as:  ZOLOFT Take 50 mg by mouth daily.        Signed: Cooper Render Jehan Bonano 07/01/2018, 8:13 AM

## 2018-07-01 NOTE — Discharge Instructions (Signed)

## 2018-07-01 NOTE — Evaluation (Signed)
Physical Therapy Evaluation and Discharge Patient Details Name: Ernest Franco MRN: [id number] DOB: 09/13/1971 Today's Date: 07/01/2018   History of Present Illness  Pt is a 47 y/o male who presents s/p T11-T12 discectomy on 06/30/18. PMH significant for DMII, chronic back pain, anxiety, R ACL repair.  Clinical Impression  Patient evaluated by Physical Therapy with no further acute PT needs identified. All education has been completed and the patient has no further questions. At the time of PT eval pt was able to perform transfers and ambulation with gross modified independence. Pt was educated on car transfer, precautions, and safe activity progression. See below for any follow-up Physical Therapy or equipment needs. PT is signing off. Thank you for this referral.     Follow Up Recommendations No PT follow up;Supervision - Intermittent    Equipment Recommendations  None recommended by PT    Recommendations for Other Services       Precautions / Restrictions Precautions Precautions: Back Precaution Booklet Issued: Yes (comment) Precaution Comments: Reviewed handout in detail. Pt was cued for precautions during functional mobility.  Required Braces or Orthoses: (No brace needed order) Restrictions Weight Bearing Restrictions: No      Mobility  Bed Mobility Overal bed mobility: Modified Independent Bed Mobility: Rolling;Sidelying to Sit           General bed mobility comments: Pt demonstrated proper log roll technique. Somewhat altered due to body habitus however back remained properly aligned throughout  Transfers Overall transfer level: Modified independent Equipment used: None Transfers: Sit to/from Stand           General transfer comment: Pt demonstrated proper hand placement on seated surface for safety.   Ambulation/Gait Ambulation/Gait assistance: Modified independent (Device/Increase time) Gait Distance (Feet): 400 Feet Assistive device: None Gait  Pattern/deviations: Step-through pattern;Decreased stride length;Wide base of support Gait velocity: Decreased Gait velocity interpretation: 1.31 - 2.62 ft/sec, indicative of limited community ambulator General Gait Details: Wide BOS due to body habitus. Overall ambulating well without noted LOB.   Stairs Stairs: Yes Stairs assistance: Modified independent (Device/Increase time) Stair Management: One rail Right;Alternating pattern;Forwards Number of Stairs: 10 General stair comments: No assist required. Pt tolerated well.   Wheelchair Mobility    Modified Rankin (Stroke Patients Only)       Balance Overall balance assessment: No apparent balance deficits (not formally assessed)                                           Pertinent Vitals/Pain Pain Assessment: 0-10 Pain Score: 6  Pain Location: Incision site Pain Descriptors / Indicators: Operative site guarding;Discomfort Pain Intervention(s): Limited activity within patient's tolerance;Monitored during session;Repositioned    Home Living Family/patient expects to be discharged to:: Private residence Living Arrangements: Spouse/significant other;Children Available Help at Discharge: Family;Available PRN/intermittently Type of Home: House Home Access: Stairs to enter Entrance Stairs-Rails: Right Entrance Stairs-Number of Steps: 3 Home Layout: One level Home Equipment: Grab bars - tub/shower;Bedside commode      Prior Function Level of Independence: Independent         Comments: Working full time - work injury     Higher education careers adviserHand Dominance   Dominant Hand: Left    Extremity/Trunk Assessment   Upper Extremity Assessment Upper Extremity Assessment: Overall WFL for tasks assessed    Lower Extremity Assessment Lower Extremity Assessment: RLE deficits/detail RLE Deficits / Details: Decreased strength consistent with  pre-op diagnosis    Cervical / Trunk Assessment Cervical / Trunk Assessment: Other  exceptions Cervical / Trunk Exceptions: s/p surgery  Communication   Communication: No difficulties  Cognition Arousal/Alertness: Awake/alert Behavior During Therapy: WFL for tasks assessed/performed Overall Cognitive Status: Within Functional Limits for tasks assessed                                        General Comments      Exercises     Assessment/Plan    PT Assessment Patent does not need any further PT services  PT Problem List         PT Treatment Interventions      PT Goals (Current goals can be found in the Care Plan section)  Acute Rehab PT Goals Patient Stated Goal: Home this morning PT Goal Formulation: All assessment and education complete, DC therapy    Frequency     Barriers to discharge        Co-evaluation               AM-PAC PT "6 Clicks" Daily Activity  Outcome Measure Difficulty turning over in bed (including adjusting bedclothes, sheets and blankets)?: None Difficulty moving from lying on back to sitting on the side of the bed? : None Difficulty sitting down on and standing up from a chair with arms (e.g., wheelchair, bedside commode, etc,.)?: None Help needed moving to and from a bed to chair (including a wheelchair)?: None Help needed walking in hospital room?: None Help needed climbing 3-5 steps with a railing? : None 6 Click Score: 24    End of Session   Activity Tolerance: Patient tolerated treatment well Patient left: Other (comment)(Standing in room with wife and NT present) Nurse Communication: Mobility status PT Visit Diagnosis: Pain Pain - part of body: (back)    Time: 0812-0828 PT Time Calculation (min) (ACUTE ONLY): 16 min   Charges:   PT Evaluation $PT Eval Moderate Complexity: 1 Mod     PT G Codes:        Ernest Franco, PT, DPT Acute Rehabilitation Services Pager: [phone]   Marylynn Pearson 07/01/2018, 8:39 AM

## 2018-07-01 NOTE — Evaluation (Signed)
Occupational Therapy Evaluation Patient Details Name: Ernest NeuJerry W Topper MRN: [id number] DOB: 10/27/1971 Today's Date: 07/01/2018    History of Present Illness Pt is a 47 y/o male who presents s/p T11-T12 discectomy on 06/30/18. PMH significant for DMII, chronic back pain, anxiety, R ACL repair.   Clinical Impression   Patient evaluated by Occupational Therapy with no further acute OT needs identified. All education has been completed and the patient has no further questions. See below for any follow-up Occupational Therapy or equipment needs. OT to sign off. Thank you for referral.      Follow Up Recommendations  No OT follow up    Equipment Recommendations  3 in 1 bedside commode;Other (comment)(RW- ALL DME Bariatric)    Recommendations for Other Services       Precautions / Restrictions Precautions Precautions: Back Precaution Booklet Issued: Yes (comment) Precaution Comments: reviewed back precautions for adls Required Braces or Orthoses: (No brace needed order) Restrictions Weight Bearing Restrictions: No      Mobility Bed Mobility Overal bed mobility: Modified Independent Bed Mobility: Rolling;Sidelying to Sit           General bed mobility comments: Pt demonstrated proper log roll technique. Somewhat altered due to body habitus however back remained properly aligned throughout  Transfers Overall transfer level: Modified independent Equipment used: None Transfers: Sit to/from Stand           General transfer comment: Pt demonstrated proper hand placement on seated surface for safety.     Balance Overall balance assessment: No apparent balance deficits (not formally assessed)                                         ADL either performed or assessed with clinical judgement   ADL Overall ADL's : Needs assistance/impaired Eating/Feeding: Independent   Grooming: Wash/dry hands   Upper Body Bathing: Independent       Upper Body Dressing :  Independent   Lower Body Dressing: Modified independent;With adaptive equipment   Toilet Transfer: Modified Independent             General ADL Comments: pt educated on the need for bariatric 3n1 due to weight limits and reports that 3n1 formly mothers would not be able to accommodate him after this education. pt plans to purchase AE (including toilet tongs)  Back handout provided and reviewed adls in detail. Pt educated on:  avoid sitting for long periods of time,correct sequence for bed mobility, avoiding lifting more than 5 pounds and never wash directly over incision. All education is complete and patient indicates understanding.     Vision Baseline Vision/History: No visual deficits       Perception     Praxis      Pertinent Vitals/Pain Pain Assessment: Faces Pain Score: 6  Faces Pain Scale: Hurts little more Pain Location: Incision site Pain Descriptors / Indicators: Operative site guarding;Discomfort Pain Intervention(s): Limited activity within patient's tolerance;Monitored during session;Repositioned     Hand Dominance Left   Extremity/Trunk Assessment Upper Extremity Assessment Upper Extremity Assessment: Overall WFL for tasks assessed   Lower Extremity Assessment Lower Extremity Assessment: Defer to PT evaluation RLE Deficits / Details: Decreased strength consistent with pre-op diagnosis   Cervical / Trunk Assessment Cervical / Trunk Assessment: Other exceptions Cervical / Trunk Exceptions: s/p surgery   Communication Communication Communication: No difficulties   Cognition Arousal/Alertness: Awake/alert Behavior During Therapy: [facility]  for tasks assessed/performed Overall Cognitive Status: Within Functional Limits for tasks assessed                                     General Comments       Exercises     Shoulder Instructions      Home Living Family/patient expects to be discharged to:: Private residence Living Arrangements:  Spouse/significant other;Children Available Help at Discharge: Family;Available PRN/intermittently Type of Home: House Home Access: Stairs to enter [employer] of Steps: 3 Entrance Stairs-Rails: Right Home Layout: One level     Bathroom Shower/Tub: Chief Strategy Officer: Standard     Home Equipment: Grab bars - tub/shower;Bedside commode          Prior Functioning/Environment Level of Independence: Independent        Comments: Working full time - work injury        OT Problem List:        OT Treatment/Interventions:      OT Goals(Current goals can be found in the care plan section) Acute Rehab OT Goals Patient Stated Goal: d/c home Potential to Achieve Goals: Good  OT Frequency:     Barriers to D/C:            Co-evaluation              AM-PAC PT "6 Clicks" Daily Activity     Outcome Measure Help from another person eating meals?: None Help from another person taking care of personal grooming?: None Help from another person toileting, which includes using toliet, bedpan, or urinal?: None Help from another person bathing (including washing, rinsing, drying)?: None Help from another person to put on and taking off regular upper body clothing?: None Help from another person to put on and taking off regular lower body clothing?: None 6 Click Score: 24   End of Session Nurse Communication: Mobility status;Precautions  Activity Tolerance: Patient tolerated treatment well Patient left: in bed;with call bell/phone within reach;with family/visitor present                   Time: 1610-9604 OT Time Calculation (min): 13 min Charges:  OT General Charges $OT Visit: 1 Visit OT Evaluation $OT Eval Moderate Complexity: 1 Mod G-Codes:      Mateo Flow   OTR/L Pager: [phone] Office: [phone] .   Boone Master B 07/01/2018, 12:13 PM

## 2018-07-01 NOTE — Progress Notes (Signed)
Patient is discharged from room 3C08 at this time. Alert and in stable condition. IV site d/c'[d and instructions read to patient and spouse with understanding verbalized. Left unit via wheelchair with all belongings at side. 

## 2018-09-24 ENCOUNTER — Other Ambulatory Visit (HOSPITAL_COMMUNITY): Payer: Self-pay | Admitting: Neurosurgery

## 2018-09-24 DIAGNOSIS — M5124 Other intervertebral disc displacement, thoracic region: Secondary | ICD-10-CM

## 2018-09-30 ENCOUNTER — Encounter (HOSPITAL_COMMUNITY): Payer: Self-pay | Admitting: *Deleted

## 2018-09-30 NOTE — Progress Notes (Signed)
I called and left Ernest Franco a voice message, requesting H/P that was doing in the last 30 days.

## 2018-10-01 NOTE — Anesthesia Preprocedure Evaluation (Addendum)
Anesthesia Evaluation  Patient identified by MRN, date of birth, ID band Patient awake    Reviewed: Allergy & Precautions, NPO status , Patient's Chart, lab work & pertinent test results  History of Anesthesia Complications (+) PONV and history of anesthetic complications  Airway Mallampati: IV  TM Distance: >3 FB Neck ROM: Full    Dental  (+) Teeth Intact, Dental Advisory Given   Pulmonary sleep apnea ,           Cardiovascular hypertension,      Neuro/Psych  Headaches, Anxiety Depression TIA   GI/Hepatic Neg liver ROS, GERD  ,  Endo/Other  diabetesMorbid obesity  Renal/GU negative Renal ROS     Musculoskeletal   Abdominal (+) + obese,   Peds  Hematology   Anesthesia Other Findings   Reproductive/Obstetrics                            Anesthesia Physical Anesthesia Plan  ASA: IV  Anesthesia Plan: General   Post-op Pain Management:  Regional for Post-op pain   Induction: Intravenous  PONV Risk Score and Plan: 2 and Treatment may vary due to age or medical condition  Airway Management Planned: Video Laryngoscope Planned and Oral ETT  Additional Equipment:   Intra-op Plan:   Post-operative Plan: Extubation in OR  Informed Consent: I have reviewed the patients History and Physical, chart, labs and discussed the procedure including the risks, benefits and alternatives for the proposed anesthesia with the patient or authorized representative who has indicated his/her understanding and acceptance.   Dental advisory given  Plan Discussed with:   Anesthesia Plan Comments:       Anesthesia Quick Evaluation

## 2018-10-01 NOTE — Progress Notes (Signed)
H&P found in Media under Radiology order, placed in chart

## 2018-10-02 ENCOUNTER — Ambulatory Visit (HOSPITAL_COMMUNITY)
Admission: RE | Admit: 2018-10-02 | Discharge: 2018-10-02 | Disposition: A | Discharge status: Home / Self Care | Payer: No Typology Code available for payment source | Source: Ambulatory Visit | Attending: Neurosurgery | Admitting: Neurosurgery

## 2018-10-02 ENCOUNTER — Encounter (HOSPITAL_COMMUNITY): Payer: Self-pay | Admitting: Surgery

## 2018-10-02 ENCOUNTER — Other Ambulatory Visit: Payer: Self-pay

## 2018-10-02 ENCOUNTER — Ambulatory Visit (HOSPITAL_COMMUNITY): Payer: No Typology Code available for payment source | Admitting: Certified Registered Nurse Anesthetist

## 2018-10-02 ENCOUNTER — Encounter (HOSPITAL_COMMUNITY): Admission: RE | Disposition: A | Discharge status: Home / Self Care | Payer: Self-pay | Source: Ambulatory Visit

## 2018-10-02 DIAGNOSIS — Z9889 Other specified postprocedural states: Secondary | ICD-10-CM | POA: Insufficient documentation

## 2018-10-02 DIAGNOSIS — E119 Type 2 diabetes mellitus without complications: Secondary | ICD-10-CM | POA: Insufficient documentation

## 2018-10-02 DIAGNOSIS — Z79899 Other long term (current) drug therapy: Secondary | ICD-10-CM | POA: Diagnosis not present

## 2018-10-02 DIAGNOSIS — M5441 Lumbago with sciatica, right side: Secondary | ICD-10-CM | POA: Diagnosis present

## 2018-10-02 DIAGNOSIS — Z6841 Body Mass Index (BMI) 40.0 and over, adult: Secondary | ICD-10-CM | POA: Diagnosis not present

## 2018-10-02 DIAGNOSIS — Z8673 Personal history of transient ischemic attack (TIA), and cerebral infarction without residual deficits: Secondary | ICD-10-CM | POA: Diagnosis not present

## 2018-10-02 DIAGNOSIS — G4730 Sleep apnea, unspecified: Secondary | ICD-10-CM | POA: Insufficient documentation

## 2018-10-02 DIAGNOSIS — I10 Essential (primary) hypertension: Secondary | ICD-10-CM | POA: Insufficient documentation

## 2018-10-02 DIAGNOSIS — Z794 Long term (current) use of insulin: Secondary | ICD-10-CM | POA: Diagnosis not present

## 2018-10-02 DIAGNOSIS — M5124 Other intervertebral disc displacement, thoracic region: Secondary | ICD-10-CM

## 2018-10-02 HISTORY — PX: RADIOLOGY WITH ANESTHESIA: SHX6223

## 2018-10-02 LAB — GLUCOSE, CAPILLARY
GLUCOSE-CAPILLARY: 133 mg/dL — AB (ref 70–99)
Glucose-Capillary: 155 mg/dL — ABNORMAL HIGH (ref 70–99)
Glucose-Capillary: 154 mg/dL — ABNORMAL HIGH (ref 70–99)

## 2018-10-02 SURGERY — MRI WITH ANESTHESIA
Anesthesia: General

## 2018-10-02 MED ORDER — GADOBUTROL 1 MMOL/ML IV SOLN
10.0000 mL | Freq: Once | INTRAVENOUS | Status: AC | PRN
  Administered 2018-10-02: 10 mL via INTRAVENOUS

## 2018-10-02 MED ORDER — HYDROCODONE-ACETAMINOPHEN 7.5-325 MG PO TABS: 1.0000 | ORAL_TABLET | Freq: Once | ORAL | Status: DC | PRN

## 2018-10-02 MED ORDER — LACTATED RINGERS IV SOLN: INTRAVENOUS | Status: DC

## 2018-10-02 MED ORDER — MEPERIDINE HCL 50 MG/ML IJ SOLN: 6.2500 mg | INTRAMUSCULAR | Status: DC | PRN

## 2018-10-02 MED ORDER — SCOPOLAMINE 1 MG/3DAYS TD PT72
MEDICATED_PATCH | TRANSDERMAL | Status: AC
  Filled 2018-10-02: qty 1

## 2018-10-02 MED ORDER — HYDROMORPHONE HCL 1 MG/ML IJ SOLN: 0.2500 mg | INTRAMUSCULAR | Status: DC | PRN

## 2018-10-02 MED ORDER — PROMETHAZINE HCL 25 MG/ML IJ SOLN: 6.2500 mg | INTRAMUSCULAR | Status: DC | PRN

## 2018-10-02 MED ORDER — ACETAMINOPHEN 10 MG/ML IV SOLN
1000.0000 mg | Freq: Once | INTRAVENOUS | Status: DC | PRN
Start: 2018-10-02 — End: 2018-10-02

## 2018-10-02 NOTE — Transfer of Care (Signed)
Immediate Anesthesia Transfer of Care Note  Patient: Ernest Franco  Procedure(s) Performed: MRI WITH ANESTHESIA OF THORACIC SPINE WITH AND WITHOUT (N/A )  Patient Location: PACU  Anesthesia Type:General  Level of Consciousness: awake, alert  and oriented  Airway & Oxygen Therapy: Patient Spontanous Breathing and Patient connected to face mask oxygen  Post-op Assessment: Report given to RN and Post -op Vital signs reviewed and stable  Post vital signs: Reviewed and stable  Last Vitals:  Vitals Value Taken Time  BP 126/77 10/02/2018 12:45 PM  Temp 36.8 C 10/02/2018 12:45 PM  Pulse 92 10/02/2018 12:53 PM  Resp 18 10/02/2018 12:53 PM  SpO2 89 % 10/02/2018 12:53 PM  Vitals shown include unvalidated device data.  Last Pain:  Vitals:   10/02/18 1245  TempSrc:   PainSc: 0-No pain      Patients Stated Pain Goal: 5 (10/02/18 0806)  Complications: No apparent anesthesia complications

## 2018-10-03 ENCOUNTER — Encounter (HOSPITAL_COMMUNITY): Payer: Self-pay | Admitting: Radiology

## 2018-10-03 LAB — HEMOGLOBIN A1C
Hgb A1c MFr Bld: 9.1 % — ABNORMAL HIGH (ref 4.8–5.6)
Mean Plasma Glucose: 214 mg/dL

## 2018-10-03 MED FILL — Fentanyl Citrate Preservative Free (PF) Inj 100 MCG/2ML: INTRAMUSCULAR | Qty: 2 | Status: AC

## 2018-10-03 MED FILL — Glycopyrrolate Inj PF Soln Prefilled Syringe 0.2 MG/ML: INTRAMUSCULAR | Qty: 1 | Status: AC

## 2018-10-03 MED FILL — Midazolam HCl Inj 2 MG/2ML (Base Equivalent): INTRAMUSCULAR | Qty: 2 | Status: AC

## 2018-10-03 MED FILL — Ondansetron HCl Inj 4 MG/2ML (2 MG/ML): INTRAMUSCULAR | Qty: 2 | Status: AC

## 2018-10-03 MED FILL — Propofol IV Emul 200 MG/20ML (10 MG/ML): INTRAVENOUS | Qty: 40 | Status: AC

## 2018-10-03 MED FILL — Phenylephrine-NaCl Pref Syr 0.4 MG/10ML-0.9% (40 MCG/ML): INTRAVENOUS | Qty: 10 | Status: AC

## 2018-10-03 MED FILL — Succinylcholine Chloride Sol Pref Syr 200 MG/10ML (20 MG/ML): INTRAVENOUS | Qty: 10 | Status: AC

## 2018-10-03 MED FILL — Lidocaine HCl(Cardiac) IV PF Soln Pref Syr 100 MG/5ML (2%): INTRAVENOUS | Qty: 5 | Status: AC

## 2018-11-02 ENCOUNTER — Other Ambulatory Visit: Payer: Self-pay

## 2018-11-02 ENCOUNTER — Emergency Department (HOSPITAL_COMMUNITY): Payer: Managed Care, Other (non HMO)

## 2018-11-02 ENCOUNTER — Emergency Department (HOSPITAL_COMMUNITY)
Admission: EM | Admit: 2018-11-02 | Discharge: 2018-11-02 | Disposition: A | Discharge status: Home / Self Care | Payer: Managed Care, Other (non HMO) | Attending: Emergency Medicine | Admitting: Emergency Medicine

## 2018-11-02 DIAGNOSIS — E119 Type 2 diabetes mellitus without complications: Secondary | ICD-10-CM | POA: Insufficient documentation

## 2018-11-02 DIAGNOSIS — Z7982 Long term (current) use of aspirin: Secondary | ICD-10-CM | POA: Insufficient documentation

## 2018-11-02 DIAGNOSIS — I10 Essential (primary) hypertension: Secondary | ICD-10-CM | POA: Insufficient documentation

## 2018-11-02 DIAGNOSIS — Z79899 Other long term (current) drug therapy: Secondary | ICD-10-CM | POA: Diagnosis not present

## 2018-11-02 DIAGNOSIS — Z794 Long term (current) use of insulin: Secondary | ICD-10-CM | POA: Insufficient documentation

## 2018-11-02 DIAGNOSIS — L0211 Cutaneous abscess of neck: Secondary | ICD-10-CM | POA: Insufficient documentation

## 2018-11-02 LAB — CBC WITH DIFFERENTIAL/PLATELET
ABS IMMATURE GRANULOCYTES: 0.06 10*3/uL (ref 0.00–0.07)
BASOS PCT: 0 %
Basophils Absolute: 0 10*3/uL (ref 0.0–0.1)
EOS ABS: 0.1 10*3/uL (ref 0.0–0.5)
Eosinophils Relative: 1 %
HCT: 50.2 % (ref 39.0–52.0)
Hemoglobin: 15.7 g/dL (ref 13.0–17.0)
Immature Granulocytes: 1 %
Lymphocytes Relative: 20 %
Lymphs Abs: 2.1 10*3/uL (ref 0.7–4.0)
MCH: 28.3 pg (ref 26.0–34.0)
MCHC: 31.3 g/dL (ref 30.0–36.0)
MCV: 90.6 fL (ref 80.0–100.0)
MONO ABS: 1 10*3/uL (ref 0.1–1.0)
Monocytes Relative: 9 %
NEUTROS ABS: 7.5 10*3/uL (ref 1.7–7.7)
Neutrophils Relative %: 69 %
PLATELETS: 239 10*3/uL (ref 150–400)
RBC: 5.54 MIL/uL (ref 4.22–5.81)
RDW: 12.7 % (ref 11.5–15.5)
WBC: 10.9 10*3/uL — AB (ref 4.0–10.5)
nRBC: 0 % (ref 0.0–0.2)

## 2018-11-02 LAB — COMPREHENSIVE METABOLIC PANEL
ALT: 32 U/L (ref 0–44)
ANION GAP: 6 (ref 5–15)
AST: 26 U/L (ref 15–41)
Albumin: 3.6 g/dL (ref 3.5–5.0)
Alkaline Phosphatase: 63 U/L (ref 38–126)
BILIRUBIN TOTAL: 1.1 mg/dL (ref 0.3–1.2)
BUN: 15 mg/dL (ref 6–20)
CO2: 27 mmol/L (ref 22–32)
Calcium: 9 mg/dL (ref 8.9–10.3)
Chloride: 103 mmol/L (ref 98–111)
Creatinine, Ser: 1.13 mg/dL (ref 0.61–1.24)
GFR calc Af Amer: >60 mL/min (ref >60)
Glucose, Bld: 184 mg/dL — ABNORMAL HIGH (ref 70–99)
POTASSIUM: 3.5 mmol/L (ref 3.5–5.1)
Sodium: 136 mmol/L (ref 135–145)
TOTAL PROTEIN: 7.3 g/dL (ref 6.5–8.1)

## 2018-11-02 MED ORDER — LIDOCAINE-EPINEPHRINE (PF) 2 %-1:200000 IJ SOLN
10.0000 mL | Freq: Once | INTRAMUSCULAR | Status: AC
  Administered 2018-11-02: 10 mL
  Filled 2018-11-02: qty 10

## 2018-11-02 MED ORDER — HYDROMORPHONE HCL 1 MG/ML IJ SOLN
1.0000 mg | Freq: Once | INTRAMUSCULAR | Status: AC
  Administered 2018-11-02: 1 mg via INTRAVENOUS
  Filled 2018-11-02: qty 1

## 2018-11-02 MED ORDER — ONDANSETRON HCL 4 MG/2ML IJ SOLN
4.0000 mg | Freq: Once | INTRAMUSCULAR | Status: AC
  Administered 2018-11-02: 4 mg via INTRAVENOUS
  Filled 2018-11-02: qty 2

## 2018-11-02 MED ORDER — LIDOCAINE HCL URETHRAL/MUCOSAL 2 % EX GEL
1.0000 "application " | Freq: Once | CUTANEOUS | Status: DC
  Filled 2018-11-02: qty 20

## 2018-11-02 MED ORDER — CLINDAMYCIN PHOSPHATE 600 MG/50ML IV SOLN
600.0000 mg | Freq: Once | INTRAVENOUS | Status: AC
  Administered 2018-11-02: 600 mg via INTRAVENOUS
  Filled 2018-11-02: qty 50

## 2018-11-02 MED ORDER — CLINDAMYCIN HCL 300 MG PO CAPS: 300.0000 mg | ORAL_CAPSULE | Freq: Three times a day (TID) | ORAL | 0 refills | Status: DC

## 2018-11-02 MED ORDER — KETOROLAC TROMETHAMINE 30 MG/ML IJ SOLN: 30.0000 mg | Freq: Once | INTRAMUSCULAR | Status: DC

## 2018-11-02 MED ORDER — IOHEXOL 300 MG/ML  SOLN
75.0000 mL | Freq: Once | INTRAMUSCULAR | Status: AC | PRN
  Administered 2018-11-02: 75 mL via INTRAVENOUS

## 2018-11-02 MED ORDER — OXYCODONE-ACETAMINOPHEN 5-325 MG PO TABS: 1.0000 | ORAL_TABLET | ORAL | 0 refills | Status: AC | PRN

## 2018-11-02 MED ORDER — SODIUM CHLORIDE 0.9 % IV BOLUS
1000.0000 mL | Freq: Once | INTRAVENOUS | Status: AC
  Administered 2018-11-02: 1000 mL via INTRAVENOUS

## 2018-11-02 MED ORDER — AMOXICILLIN-POT CLAVULANATE 875-125 MG PO TABS: 1.0000 | ORAL_TABLET | Freq: Two times a day (BID) | ORAL | 0 refills | Status: AC

## 2018-11-02 MED ORDER — OXYCODONE-ACETAMINOPHEN 5-325 MG PO TABS
1.0000 | ORAL_TABLET | Freq: Once | ORAL | Status: AC
  Administered 2018-11-02: 1 via ORAL
  Filled 2018-11-02: qty 1

## 2018-11-02 NOTE — Progress Notes (Signed)
Patient ID: Ernest Franco, male   DOB: May 24, 1971, 47 y.o.   MRN: [id number] Prior neck abscess drained by Dr Corliss Skains this year. Has noted a mass there previously.  Dressing changes for 3 months.  This reappeared on Thursday and is painful. Er has drained it. There is abscess on ct, on exam there is induration present, no real erythema, packing in place.  I think reasonable to send home with oral abx and will have office call for fu. If not better may need operative drainage.  Will have office call them tomorrow. Discussed failure of this plan

## 2018-11-02 NOTE — ED Provider Notes (Signed)
[facility] EMERGENCY DEPARTMENT Provider Note   CSN: [id number] Arrival date & time: 11/02/18  0114     History   Chief Complaint Chief Complaint  Patient presents with  . Abscess    HPI Ernest Franco is a 47 y.o. male.  46 y/o male with a PMH of DM,HTN, GERD presents to the ED with a chief complaint of abscess since x 3 days. Patient had an posterior neck abscess in January which needed to be drained in the operating room. He reports last time this happened, they tried draining the abscess in office but due to its depth he was sent to the ED  By [facility]. Today he presents with a recurrent abscess in the same location. He has reports pain along the region and worse with touch or laying down.He reports not taking any medication for pain relieve. He denies any fever, chills, or neck pain.      Past Medical History:  Diagnosis Date  . Anxiety   . Chronic back pain   . Complication of anesthesia   . Depression   . Diabetes mellitus without complication ([place])    Type II  . GERD (gastroesophageal reflux disease)   . Headache    2017- headache - hospitalized- to rule out stroke  . Morbid obesity ([place])   . PONV (postoperative nausea and vomiting)    after MRI  . Sleep apnea     Patient Active Problem List   Diagnosis Date Noted  . Lumbar disc herniation 06/30/2018  . Uncontrolled diabetes mellitus type 2 without complications ([place]) 83/66/2947  . HTN (hypertension) 01/08/2018  . Morbid obesity with BMI of 60.0-69.9, adult ([place]) 01/08/2018  . Neck abscess 01/06/2018  . CVA (cerebral vascular accident) ([place]) 10/04/2016  . Chest pain 10/03/2016  . Hyperglycemia 10/03/2016  . Elevated blood pressure reading 10/03/2016  . Chronic edema 10/03/2016  . Muscle weakness of left upper extremity 10/03/2016  . TIA (transient ischemic attack) 10/02/2016    Past Surgical History:  Procedure Laterality Date  . CHOLECYSTECTOMY    . COLONOSCOPY      . INCISION AND DRAINAGE ABSCESS N/A 01/06/2018   Procedure: INCISION AND DRAINAGE OF POSTERIOR NECK ABSCESS;  Surgeon: Donnie Mesa, MD;  Location: [place];  Service: General;  Laterality: N/A;  . KNEE SURGERY Right    ACL  . RADIOLOGY WITH ANESTHESIA N/A 03/27/2018   Procedure: MRI WITH THORACIC SPINE WITHOUT CONTRAST;  Surgeon: Radiologist, Medication, MD;  Location: [facility];  Service: Radiology;  Laterality: N/A;  . RADIOLOGY WITH ANESTHESIA N/A 10/02/2018   Procedure: MRI WITH ANESTHESIA OF THORACIC SPINE WITH AND WITHOUT;  Surgeon: Radiologist, Medication, MD;  Location: [facility];  Service: Radiology;  Laterality: N/A;  . THORACIC DISCECTOMY Right 06/30/2018   Procedure: Microdiscectomy - right - Thoracic eleven-Thoracic twelve;  Surgeon: Earnie Larsson, MD;  Location: [facility];  Service: Neurosurgery;  Laterality: Right;  . WISDOM TOOTH EXTRACTION          Home Medications    Prior to Admission medications   Medication Sig Start Date End Date Taking? Authorizing Provider  aspirin EC 81 MG tablet Take 81 mg by mouth daily.   Yes [provider]  buPROPion (WELLBUTRIN XL) 300 MG 24 hr tablet Take 300 mg by mouth daily.   Yes [provider]  furosemide (LASIX) 20 MG tablet Take 40 mg by mouth daily.   Yes [provider]  Insulin Glargine (BASAGLAR KWIKPEN) 100 UNIT/ML  SOPN Inject 0.15 mLs (15 Units total) into the skin at bedtime. Patient taking differently: Inject 30 Units into the skin at bedtime.  01/08/18  Yes Meuth, Brooke A, PA-C  JANUVIA 100 MG tablet Take 100 mg by mouth daily. 03/17/18  Yes [provider]  JARDIANCE 25 MG TABS tablet Take 25 mg by mouth daily. 03/17/18  Yes [provider]  omeprazole (PRILOSEC OTC) 20 MG tablet Take 40 mg by mouth daily.   Yes [provider]  sertraline (ZOLOFT) 50 MG tablet Take 50 mg by mouth daily. 12/04/17  Yes [provider]  amoxicillin-clavulanate (AUGMENTIN) 875-125 MG tablet Take 1  tablet by mouth every 12 (twelve) hours for 7 days. 11/02/18 11/09/18  Janeece Fitting, PA-C  cyclobenzaprine (FLEXERIL) 10 MG tablet Take 1 tablet (10 mg total) by mouth 3 (three) times daily as needed for muscle spasms. Patient not taking: Reported on 09/29/2018 07/01/18   Earnie Larsson, MD  glipiZIDE (GLUCOTROL) 5 MG tablet Take 1 tablet (5 mg total) by mouth 2 (two) times daily with a meal. Patient not taking: Reported on 03/20/2018 01/08/18   Margie Billet A, PA-C  glucose blood (COOL BLOOD GLUCOSE TEST STRIPS) test strip Use as instructed 01/08/18   Meuth, Brooke A, PA-C  glucose monitoring kit (FREESTYLE) monitoring kit 1 each by Does not apply route as needed for other. 01/08/18   Meuth, Brooke A, PA-C  oxyCODONE (OXY IR/ROXICODONE) 5 MG immediate release tablet Take 1-2 tablets (5-10 mg total) by mouth every 6 (six) hours as needed for severe pain. Patient not taking: Reported on 09/29/2018 07/01/18   Earnie Larsson, MD  oxyCODONE-acetaminophen (PERCOCET/ROXICET) 5-325 MG tablet Take 1 tablet by mouth every 4 (four) hours as needed for up to 3 days for severe pain. 11/02/18 11/05/18  Janeece Fitting, PA-C    Family History Family History  Problem Relation Age of Onset  . Diabetes Mellitus II Mother     Social History Social History   Tobacco Use  . Smoking status: Never Smoker  . Smokeless tobacco: Never Used  Substance Use Topics  . Alcohol use: Yes    Comment: occasional  . Drug use: No     Allergies   Other   Review of Systems Review of Systems  Constitutional: Negative for chills and fever.  HENT: Negative for sore throat.   Respiratory: Negative for shortness of breath.   Cardiovascular: Negative for chest pain.  Gastrointestinal: Negative for abdominal pain, diarrhea, nausea and vomiting.  Genitourinary: Negative for dysuria and flank pain.  Musculoskeletal: Positive for neck pain. Negative for back pain and neck stiffness.  Skin: Positive for color change and wound.  Neurological:  Negative for headaches.     Physical Exam Updated Vital Signs BP 127/72   Pulse 75   Temp 98.2 F (36.8 C) (Oral)   Resp 19   Ht '5\' 7"'$  (1.702 m)   Wt (!) 165.6 kg   SpO2 98%   BMI 57.17 kg/m   Physical Exam  Constitutional: He is oriented to person, place, and time. He appears well-developed and well-nourished.  Non-toxic appearing.   HENT:  Head: Normocephalic and atraumatic.  Neck: Normal range of motion. Neck supple.  Cardiovascular: Normal heart sounds.  Pulmonary/Chest: Breath sounds normal.  Abdominal: Soft.  Musculoskeletal: He exhibits no tenderness.  Neurological: He is alert and oriented to person, place, and time.  Skin: Skin is warm and dry. There is erythema.     Large area of induration on the  posterior neck, close to the c spine midline.   Nursing note and vitals reviewed.      ED Treatments / Results  Labs (all labs ordered are listed, but only abnormal results are displayed) Labs Reviewed  COMPREHENSIVE METABOLIC PANEL - Abnormal; Notable for the following components:      Result Value   Glucose, Bld 184 (*)    All other components within normal limits  CBC WITH DIFFERENTIAL/PLATELET - Abnormal; Notable for the following components:   WBC 10.9 (*)    All other components within normal limits    EKG None  Radiology Ct Soft Tissue Neck W Contrast  Result Date: 11/02/2018 CLINICAL DATA:  Neck mass, solitary. Patient states he has a neck abscess. EXAM: CT NECK WITH CONTRAST TECHNIQUE: Multidetector CT imaging of the neck was performed using the standard protocol following the bolus administration of intravenous contrast. CONTRAST:  93m OMNIPAQUE IOHEXOL 300 MG/ML  SOLN COMPARISON:  CT of the neck/2/19 FINDINGS: Pharynx and larynx: No focal mucosal or submucosal lesions are present. Nasopharynx is clear. The soft palate and tongue base are within normal limits. Palatine tonsils are unremarkable. The hypopharynx and epiglottis are normal. Vocal  cords are midline and symmetric. The trachea is unremarkable. Salivary glands: The submandibular and parotid glands are normal bilaterally. Intraparotid lymph nodes are stable. Thyroid: Normal Lymph nodes: Bilateral suboccipital lymph nodes are similar to the prior exam. No significant cervical adenopathy is present. Vascular: Negative. Limited intracranial: Within normal limits. Visualized orbits: Globes and orbits are unremarkable. Mastoids and visualized paranasal sinuses: The paranasal sinuses and mastoid air cells are clear. Skeleton: Vertebral body heights are maintained. AP alignment is anatomic. There is straightening of the normal cervical lordosis. Upper chest: The lung apices are clear. Thoracic inlet is within normal limits. Other: A subcutaneous collection in the posterior left neck extending to the skin surface measures 1.3 x 4.3 x 2.7 cm. The collection extends from the skin surface to the deep fascia. Surrounding inflammatory changes are associated. There is gas superficially, suggesting the lesion is open. IMPRESSION: 1. 1.3 x 4.3 x 2.7 cm fluid collection extending from the skin surface to the deep cervical fascia consistent with a subcutaneous abscess. The collection appears to be open at the skin. 2. Adjacent inflammatory changes noted. 3. Stable suboccipital lymph nodes. 4. No significant anterior disease of the neck. Electronically Signed   By: CSan MorelleM.D.   On: 11/02/2018 10:05    Procedures ..Marland Kitchenncision and Drainage Date/Time: 11/02/2018 8:28 AM Performed by: SJaneece Fitting PA-C Authorized by: SJaneece Fitting PA-C   Consent:    Consent obtained:  Verbal   Consent given by:  Patient and spouse   Risks discussed:  Bleeding, incomplete drainage and infection   Alternatives discussed:  No treatment Location:    Type:  Abscess   Size:  5   Location:  Neck   Neck location:  L posterior Pre-procedure details:    Skin preparation:  Betadine Anesthesia (see MAR for exact  dosages):    Anesthesia method:  Local infiltration   Local anesthetic:  Lidocaine 2% WITH epi Procedure details:    Incision types:  Stab incision   Incision depth:  Dermal   Scalpel blade:  11   Wound management:  Probed and deloculated and irrigated with saline   Drainage:  Purulent   Drainage amount:  Moderate   Wound treatment:  Drain placed   Packing materials:  1/2 in gauze   Amount 1/2":  2 Post-procedure  details:    Patient tolerance of procedure:  Tolerated well, no immediate complications   (including critical care time)  Medications Ordered in ED Medications  sodium chloride 0.9 % bolus 1,000 mL (0 mLs Intravenous Stopped 11/02/18 0801)  oxyCODONE-acetaminophen (PERCOCET/ROXICET) 5-325 MG per tablet 1 tablet (1 tablet Oral Given 11/02/18 0751)  lidocaine-EPINEPHrine (XYLOCAINE W/EPI) 2 %-1:200000 (PF) injection 10 mL (10 mLs Infiltration Given by Other 11/02/18 0824)  clindamycin (CLEOCIN) IVPB 600 mg (0 mg Intravenous Stopped 11/02/18 0950)  ondansetron (ZOFRAN) injection 4 mg (4 mg Intravenous Given 11/02/18 0929)  iohexol (OMNIPAQUE) 300 MG/ML solution 75 mL (75 mLs Intravenous Contrast Given 11/02/18 0938)  HYDROmorphone (DILAUDID) injection 1 mg (1 mg Intravenous Given 11/02/18 1050)     Initial Impression / Assessment and Plan / ED Course  I have reviewed the triage vital signs and the nursing notes.  Pertinent labs & imaging results that were available during my care of the patient were reviewed by me and considered in my medical decision making (see chart for details).   Patient presents with recurrent posterior neck abscess x 3 days. Patient had a similar abscess drained intraoperatively drained by Dr. Georgette Dover in January. He denies any fever during this visit. CBC showed slight increase in WBC. CMP showed glucose at 184, patient reports compliance with medication since diagnosed with DM. Creatine level is normal at 1.13. I have discussed this case with Dr. Roxanne Mins who has  seen the patient plan is to attempt draining  abscess in the ED.   8:32 AM I&D performed in the ED after patient received percocet for pain, he tolerated the procedure but wife states a surgeon needs to evaluate him as last time this happened he needed the abscess surgically drained. Will place call to Dr. Donne Hazel general surgery.   Spoke to [place] for their recommendation, patient is afebrile and he is stable for outpatient follow up this week.  9:08 AM I have spoken to wife at the bedside who is very upset and reports "they don't want to get out of bed to see him" "he is in so much pain" "this is the same thing that happened last time". I have explained to wife that his blood work is unremarkable, he is afebrile and can be treated outpatient until he sees them this week, she is requesting to have general surgery "do something about it today", will place call to general surgery and order CT soft tissue along with IV clindamycin.   CT neck soft tissue showed: 1. 1.3 x 4.3 x 2.7 cm fluid collection extending from the skin surface to the deep cervical fascia consistent with a subcutaneous abscess. The collection appears to be open at the skin. 2. Adjacent inflammatory changes noted. 3. Stable suboccipital lymph nodes. 4. No significant anterior disease of the neck.  10:45AM Spoke to [place], general surgery who will talk to Dr. Donne Hazel for his recommendations pending their plan.   12:31 PM Spoke to Dr. Donne Hazel who recommends patient be discharged with a prescription for Augmentin home, he has seen this patient and evaluated him and will have him follow-up outpatient.  Final Clinical Impressions(s) / ED Diagnoses   Final diagnoses:  Neck abscess    ED Discharge Orders         Ordered    clindamycin (CLEOCIN) 300 MG capsule  3 times daily,   Status:  Discontinued     11/02/18 0857    oxyCODONE-acetaminophen (PERCOCET/ROXICET) 5-325 MG tablet  Every 4 hours PRN  11/02/18 0901     amoxicillin-clavulanate (AUGMENTIN) 875-125 MG tablet  Every 12 hours     11/02/18 1233           Janeece Fitting, PA-C 31/59/45 [id number]    Delora Fuel, MD 92/44/62 [id number]

## 2018-11-02 NOTE — Discharge Instructions (Addendum)
I have prescribed antibiotics for your infection, please take 1 tablet daily for the next 7 days. I have also provided medication for your pain, please take this medication as needed for severe pain. You may alternate ibuprofen or tylenol for mild/moderate pain. Please follow up with Dr. Dwain Sarna as recommended.

## 2018-11-02 NOTE — ED Notes (Signed)
Patient transported to CT 

## 2018-11-02 NOTE — ED Triage Notes (Signed)
Patient c/o abscess that began Thursday (4 days ago). Patient states that he had an abscess in the exact same spot in January (a surgeon removed it).

## 2020-01-01 DIAGNOSIS — Z903 Acquired absence of stomach [part of]: Secondary | ICD-10-CM

## 2020-01-01 HISTORY — PX: LAPAROSCOPIC GASTRIC SLEEVE RESECTION: SHX5895

## 2020-01-01 HISTORY — DX: Acquired absence of stomach (part of): Z90.3

## 2020-03-03 IMAGING — MR MR THORACIC SPINE WO/W CM
5 of 9 series · 24 of 48 positions shown · IV contrast (agent unspecified)
Comparison: Thoracic MRI 03/19/2018

CLINICAL DATA: Thoracic disc herniation. History of thoracic
surgery.

EXAM:
MRI THORACIC WITHOUT AND WITH CONTRAST
TECHNIQUE: Multiplanar and multiecho pulse sequences of the thoracic spine were
obtained without and with intravenous contrast.
CONTRAST:  10 mL Gadovist IV

[Series 14: T1 · sagittal · 6.0mm · 1.23mm/px · 1 of 9 slices shown (1 of 3)]
[im 1/9]
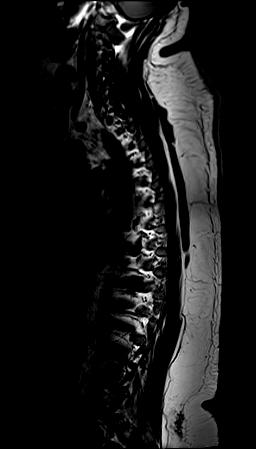

[Series 15: T2 · sagittal · 3.0mm · 0.76mm/px · 3 of 17 slices shown (1 of 2)]
[im 1/17]
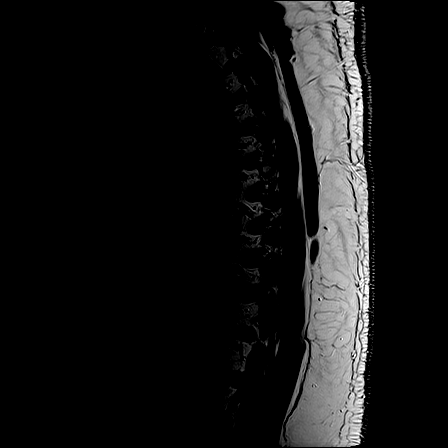
[im 9/17]
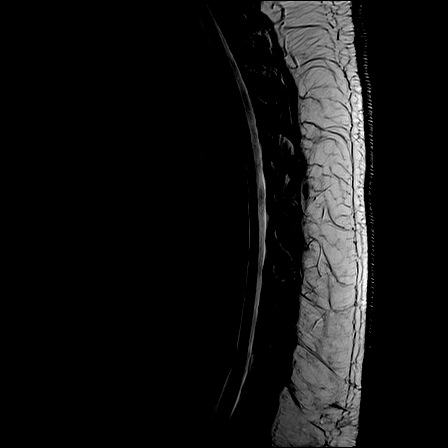
[im 17/17]
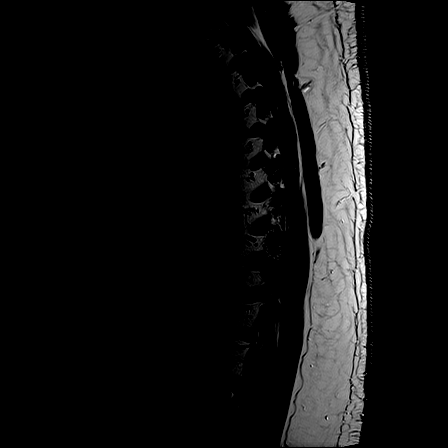

[Series 16: T1 · sagittal · 3.0mm · 0.76mm/px · 4 of 17 slices shown (2 of 3)]
[im 1/17]
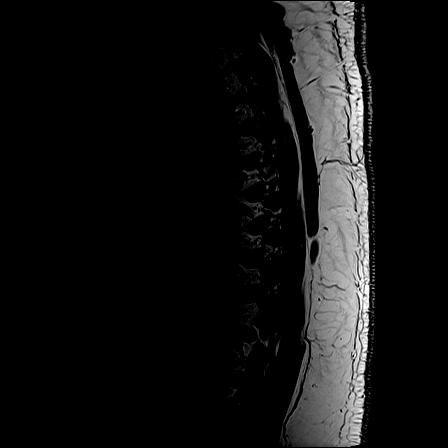
[im 6/17]
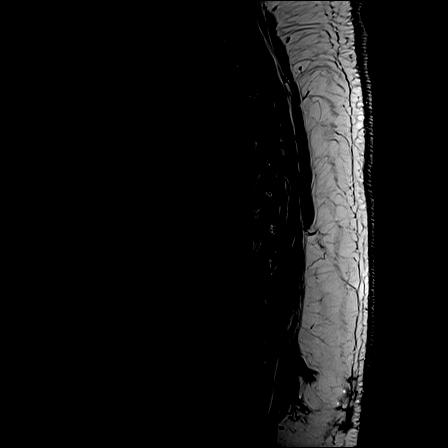
[im 11/17]
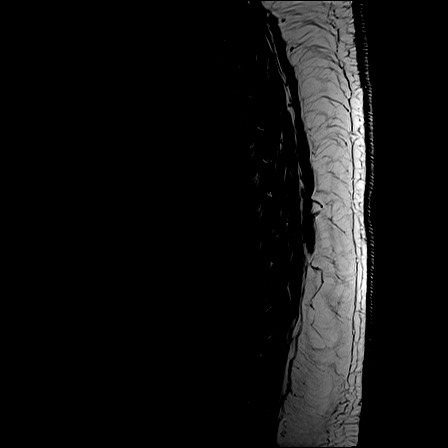
[im 17/17]
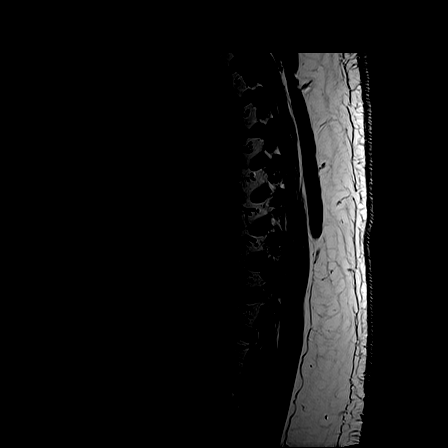

[Series 18: T2 · axial · 4.0mm · 0.59mm/px · z∈[-211,+21]mm · 8 of 39 slices shown (2 of 2)]
[im 1/39]
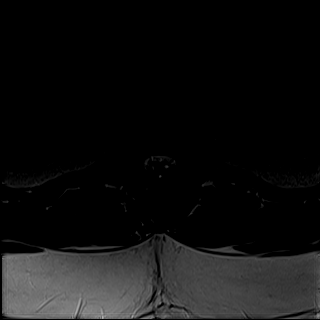
[im 6/39]
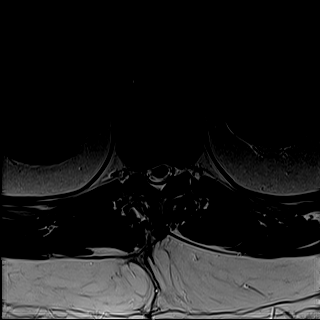
[im 11/39]
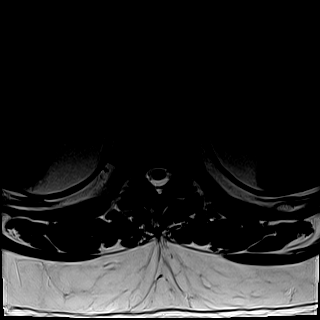
[im 17/39]
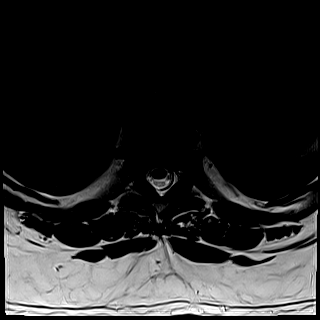
[im 22/39]
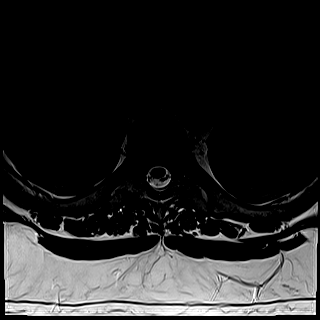
[im 28/39]
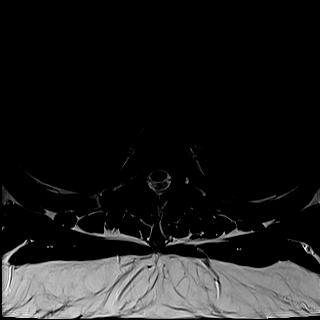
[im 33/39]
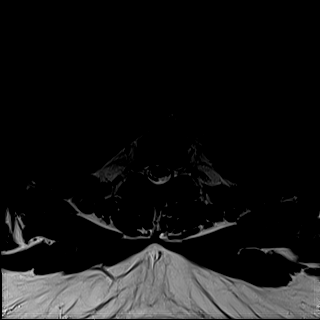
[im 39/39]
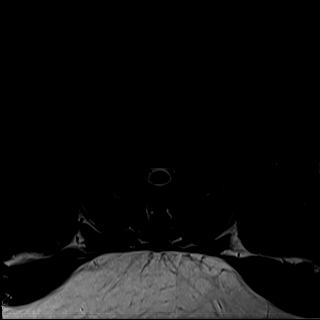

[Series 20: T1 · axial · non-contrast · 4.0mm · 0.31mm/px · z∈[-211,+21]mm · 8 of 39 slices shown (3 of 3)]
[im 1/39]
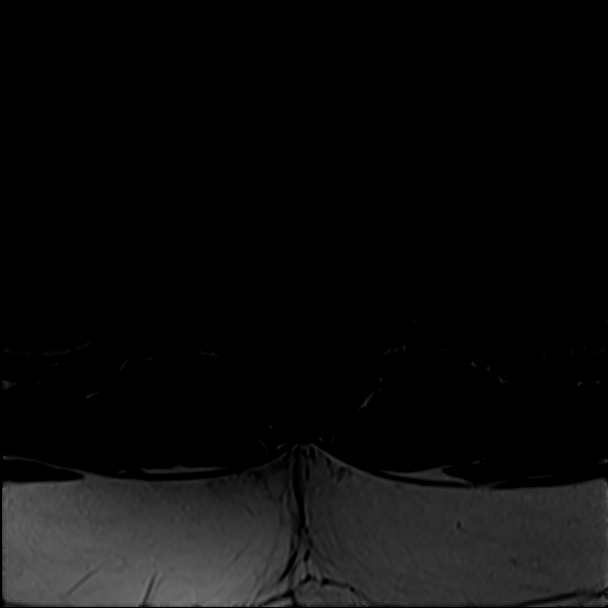
[im 6/39]
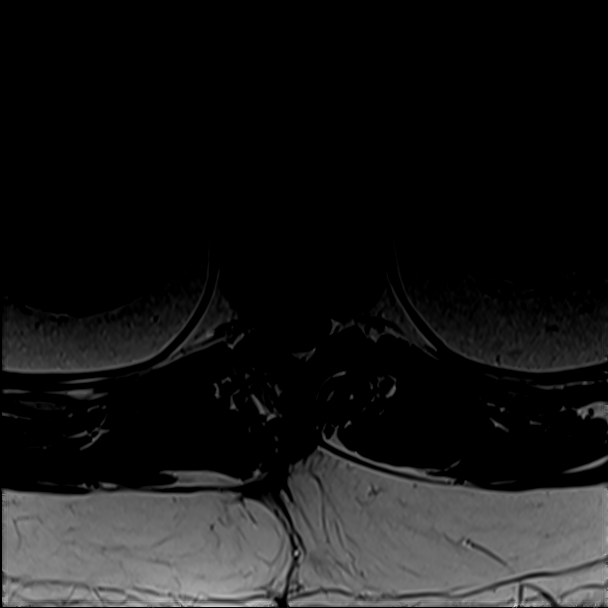
[im 11/39]
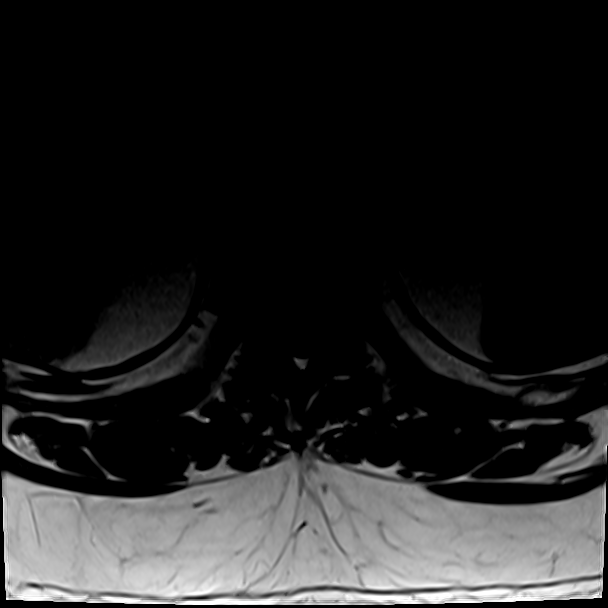
[im 17/39]
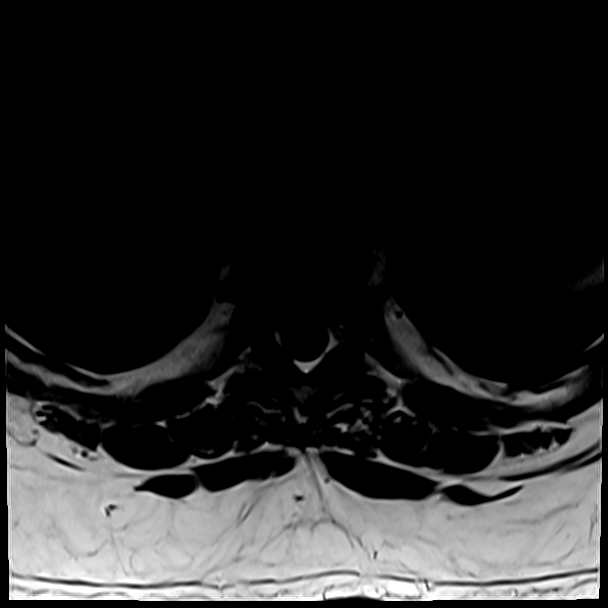
[im 22/39]
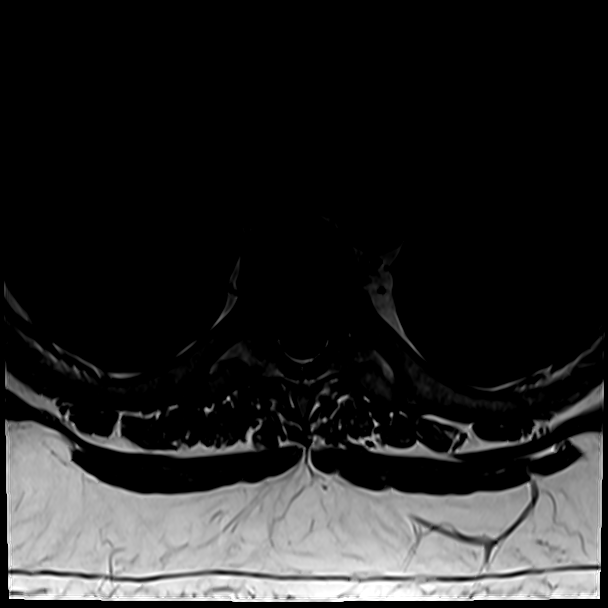
[im 28/39]
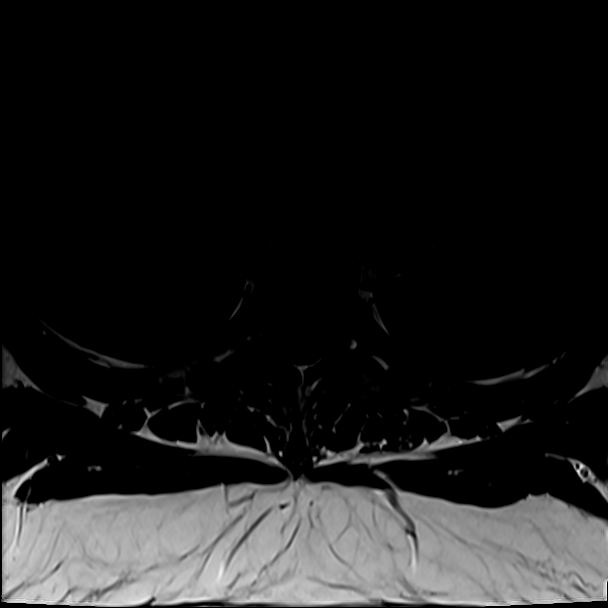
[im 33/39]
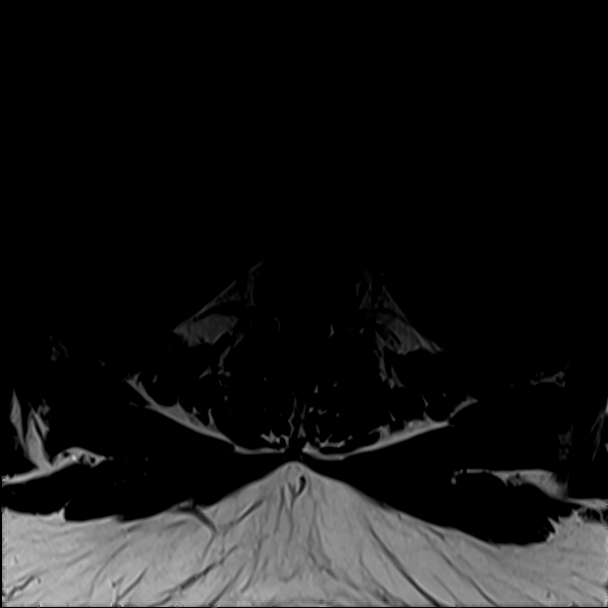
[im 39/39]
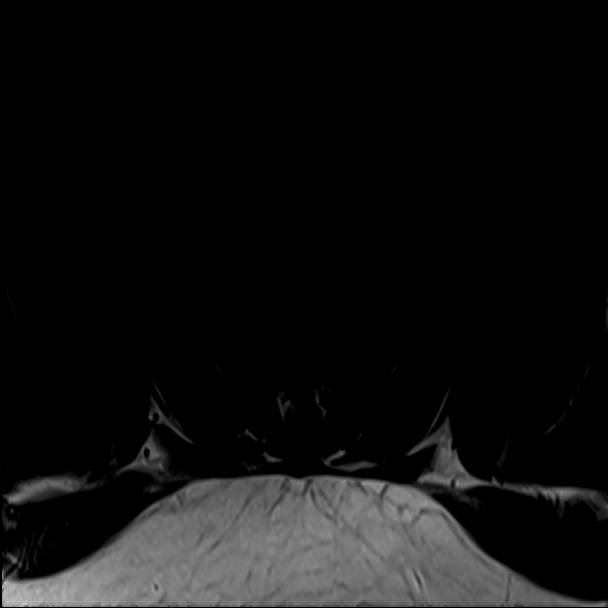

[24 of 48 positions shown; findings below may reference images not displayed]

FINDINGS: MRI THORACIC SPINE FINDINGS

Alignment:  Normal alignment.

MRI was performed under general anesthesia.

Vertebrae: Negative for fracture or mass lesion.

Cord: Negative for cord compression. Normal spinal cord signal and
enhancement.

Paraspinal and other soft tissues: Postsurgical changes on the right
at T11-T12. No soft tissue fluid collection or mass. Bibasilar
airspace disease most likely atelectasis.

Disc levels:

Small central disc protrusion T8-9.

Small central disc protrusion T9-10

Postop laminectomy and discectomy on the right at T11-12. No
recurrent disc protrusion. Mild cord flattening on the right due to
spurring. Negative for stenosis.
IMPRESSION: Postop laminectomy and discectomy on the right at T11-T12. No
recurrent disc protrusion or significant stenosis. Negative for
fluid collection

Small central disc protrusions at T8-9 and T9-10 unchanged.

## 2022-01-10 ENCOUNTER — Ambulatory Visit: Payer: Self-pay | Admitting: Surgery

## 2022-01-10 NOTE — H&P (Signed)
History of Present Illness: Ernest Franco is a 51 y.o. male who is seen today for follow-up of a recurrent abscess to the back of his neck that was drained 2 weeks ago.  He is feeling much better today and says the wound is nearly closed.  He has only minimal drainage.  He denies fevers or chills.  He is very interested in having the area excised, as he has had intermittent pain and drainage over the last few years since his last surgical debridement.   Review of Systems: A complete review of systems was obtained from the patient.  I have reviewed this information and discussed as appropriate with the patient.  See HPI as well for other ROS.     Medical History: Past Medical History Past Medical History: Diagnosis Date  Diabetes mellitus without complication (CMS-HCC)    GERD (gastroesophageal reflux disease)    Hyperlipidemia    Hypertension    Sleep apnea        Patient Active Problem List Diagnosis  Infected sebaceous cyst     Past Surgical History Past Surgical History: Procedure Laterality Date  sleeve gastroplasty   2021  I&D of neck abscess          Allergies Allergies Allergen Reactions  Metformin Diarrhea and Nausea      Current Outpatient Medications on File Prior to Visit Medication Sig Dispense Refill  sertraline (ZOLOFT) 100 MG tablet TAKE 1 AND 1/2 TABLETS BY MOUTH EVERY DAY FOR MOOD       No current facility-administered medications on file prior to visit.     Family History Family History Problem Relation Age of Onset  Hyperlipidemia (Elevated cholesterol) Mother    Diabetes Mother    Coronary Artery Disease (Blocked arteries around heart) Mother        Social History   Tobacco Use Smoking Status Never Smokeless Tobacco Never     Social History Social History    Socioeconomic History  Marital status: Married Tobacco Use  Smoking status: Never  Smokeless tobacco: Never Substance and Sexual Activity  Alcohol use: Never       Objective:     There were no vitals filed for this visit.  There is no height or weight on file to calculate BMI.   Physical Exam Constitutional:      General: He is not in acute distress. HENT:     Head: Normocephalic and atraumatic.  Eyes:     General: No scleral icterus.    Conjunctiva/sclera: Conjunctivae normal.  Neck:     Comments: I&D site on posterior neck is nearly closed, with a small wound at the central portion that is granulating.  No surrounding erythema or induration. Pulmonary:     Effort: Pulmonary effort is normal. No respiratory distress.  Skin:    General: Skin is warm and dry.     Coloration: Skin is not jaundiced.  Neurological:     General: No focal deficit present.     Mental Status: He is alert and oriented to person, place, and time.             Assessment and Plan:    Diagnoses and all orders for this visit:   Infected sebaceous cyst       This is a 51 year old male with a recurrent abscess on the posterior neck.  The history is most consistent with a recurrent sebaceous cyst.  He reports significant pain at the site every few months and is interested in having  a re-excision.  I will plan to do a wide excision of the area in the operating room to minimize the risk of recurrence, but I discussed the risk of recurrence cannot be completely eliminated.  I discussed that he may again have an open wound that he will need to pack.  He expressed understanding and agrees to proceed.  He will be contacted to schedule an elective surgery date.  Sophronia Simas, MD [facility] General, Hepatobiliary and Pancreatic Surgery 01/10/22 12:04 PM

## 2022-01-12 ENCOUNTER — Other Ambulatory Visit: Payer: Self-pay

## 2022-01-12 ENCOUNTER — Encounter (HOSPITAL_BASED_OUTPATIENT_CLINIC_OR_DEPARTMENT_OTHER): Payer: Self-pay | Admitting: Surgery

## 2022-01-19 ENCOUNTER — Ambulatory Visit (HOSPITAL_BASED_OUTPATIENT_CLINIC_OR_DEPARTMENT_OTHER): Admission: RE | Admit: 2022-01-19 | Payer: 59 | Source: Home / Self Care | Admitting: Surgery

## 2022-01-19 SURGERY — EXCISION, LESION, NECK
Anesthesia: Choice

## 2024-12-27 ENCOUNTER — Observation Stay (HOSPITAL_BASED_OUTPATIENT_CLINIC_OR_DEPARTMENT_OTHER)
Admission: EM | Admit: 2024-12-27 | Discharge: 2024-12-29 | Disposition: A | Attending: Emergency Medicine | Admitting: Emergency Medicine

## 2024-12-27 ENCOUNTER — Emergency Department (HOSPITAL_BASED_OUTPATIENT_CLINIC_OR_DEPARTMENT_OTHER)

## 2024-12-27 ENCOUNTER — Encounter (HOSPITAL_BASED_OUTPATIENT_CLINIC_OR_DEPARTMENT_OTHER): Payer: Self-pay | Admitting: Emergency Medicine

## 2024-12-27 ENCOUNTER — Other Ambulatory Visit: Payer: Self-pay

## 2024-12-27 DIAGNOSIS — I10 Essential (primary) hypertension: Secondary | ICD-10-CM | POA: Diagnosis not present

## 2024-12-27 DIAGNOSIS — R4781 Slurred speech: Principal | ICD-10-CM | POA: Insufficient documentation

## 2024-12-27 DIAGNOSIS — E785 Hyperlipidemia, unspecified: Secondary | ICD-10-CM | POA: Diagnosis not present

## 2024-12-27 DIAGNOSIS — R42 Dizziness and giddiness: Secondary | ICD-10-CM | POA: Diagnosis present

## 2024-12-27 DIAGNOSIS — Z6841 Body Mass Index (BMI) 40.0 and over, adult: Secondary | ICD-10-CM | POA: Insufficient documentation

## 2024-12-27 DIAGNOSIS — E119 Type 2 diabetes mellitus without complications: Secondary | ICD-10-CM | POA: Diagnosis not present

## 2024-12-27 DIAGNOSIS — F32A Depression, unspecified: Secondary | ICD-10-CM | POA: Insufficient documentation

## 2024-12-27 DIAGNOSIS — E669 Obesity, unspecified: Secondary | ICD-10-CM | POA: Insufficient documentation

## 2024-12-27 DIAGNOSIS — Z79899 Other long term (current) drug therapy: Secondary | ICD-10-CM | POA: Diagnosis not present

## 2024-12-27 DIAGNOSIS — Z794 Long term (current) use of insulin: Secondary | ICD-10-CM | POA: Diagnosis not present

## 2024-12-27 DIAGNOSIS — R479 Unspecified speech disturbances: Secondary | ICD-10-CM

## 2024-12-27 LAB — COMPREHENSIVE METABOLIC PANEL WITH GFR
ALT: 13 U/L (ref 0–44)
AST: 18 U/L (ref 15–41)
Albumin: 3.5 g/dL (ref 3.5–5.0)
Alkaline Phosphatase: 54 U/L (ref 38–126)
Anion gap: 9 (ref 5–15)
BUN: 14 mg/dL (ref 6–20)
CO2: 24 mmol/L (ref 22–32)
Calcium: 8.1 mg/dL — ABNORMAL LOW (ref 8.9–10.3)
Chloride: 107 mmol/L (ref 98–111)
Creatinine, Ser: 0.87 mg/dL (ref 0.61–1.24)
GFR, Estimated: >60 mL/min
Glucose, Bld: 126 mg/dL — ABNORMAL HIGH (ref 70–99)
Potassium: 3.7 mmol/L (ref 3.5–5.1)
Sodium: 140 mmol/L (ref 135–145)
Total Bilirubin: 0.2 mg/dL (ref 0.0–1.2)
Total Protein: 5.4 g/dL — ABNORMAL LOW (ref 6.5–8.1)

## 2024-12-27 LAB — CBC
HCT: 41.2 % (ref 39.0–52.0)
Hemoglobin: 14.7 g/dL (ref 13.0–17.0)
MCH: 30.8 pg (ref 26.0–34.0)
MCHC: 35.7 g/dL (ref 30.0–36.0)
MCV: 86.4 fL (ref 80.0–100.0)
Platelets: 188 K/uL (ref 150–400)
RBC: 4.77 MIL/uL (ref 4.22–5.81)
RDW: 11.9 % (ref 11.5–15.5)
WBC: 8 K/uL (ref 4.0–10.5)
nRBC: 0 % (ref 0.0–0.2)

## 2024-12-27 LAB — APTT: aPTT: 27 s (ref 24–36)

## 2024-12-27 LAB — URINE DRUG SCREEN
Amphetamines: NEGATIVE
Barbiturates: NEGATIVE
Benzodiazepines: NEGATIVE
Cocaine: NEGATIVE
Fentanyl: NEGATIVE
Methadone Scn, Ur: NEGATIVE
Opiates: NEGATIVE
Tetrahydrocannabinol: NEGATIVE

## 2024-12-27 LAB — DIFFERENTIAL
Abs Immature Granulocytes: 0.05 K/uL (ref 0.00–0.07)
Basophils Absolute: 0 K/uL (ref 0.0–0.1)
Basophils Relative: 0 %
Eosinophils Absolute: 0.1 K/uL (ref 0.0–0.5)
Eosinophils Relative: 2 %
Immature Granulocytes: 1 %
Lymphocytes Relative: 26 %
Lymphs Abs: 2.1 K/uL (ref 0.7–4.0)
Monocytes Absolute: 0.6 K/uL (ref 0.1–1.0)
Monocytes Relative: 7 %
Neutro Abs: 5.1 K/uL (ref 1.7–7.7)
Neutrophils Relative %: 64 %

## 2024-12-27 LAB — URINALYSIS, W/ REFLEX TO CULTURE (INFECTION SUSPECTED)
Bilirubin Urine: NEGATIVE
Glucose, UA: NEGATIVE mg/dL
Hgb urine dipstick: NEGATIVE
Ketones, ur: NEGATIVE mg/dL
Leukocytes,Ua: NEGATIVE
Nitrite: NEGATIVE
Protein, ur: NEGATIVE mg/dL
Specific Gravity, Urine: 1.015 (ref 1.005–1.030)
pH: 6 (ref 5.0–8.0)

## 2024-12-27 LAB — RESP PANEL BY RT-PCR (RSV, FLU A&B, COVID)  RVPGX2
Influenza A by PCR: NEGATIVE
Influenza B by PCR: NEGATIVE
Resp Syncytial Virus by PCR: NEGATIVE
SARS Coronavirus 2 by RT PCR: NEGATIVE

## 2024-12-27 LAB — PROTIME-INR
INR: 1 (ref 0.8–1.2)
Prothrombin Time: 13.3 s (ref 11.4–15.2)

## 2024-12-27 LAB — CBG MONITORING, ED: Glucose-Capillary: 110 mg/dL — ABNORMAL HIGH (ref 70–99)

## 2024-12-27 MED ORDER — IOHEXOL 350 MG/ML SOLN
75.0000 mL | Freq: Once | INTRAVENOUS | Status: AC | PRN
  Administered 2024-12-27: 75 mL via INTRAVENOUS

## 2024-12-27 MED ORDER — SODIUM CHLORIDE 0.9 % IV BOLUS
1000.0000 mL | Freq: Once | INTRAVENOUS | Status: AC
  Administered 2024-12-27: 1000 mL via INTRAVENOUS

## 2024-12-27 NOTE — Consult Note (Addendum)
 [facility] Tele[facility]urology Consult Services  Stat Consult  Patient Name:   Ernest Franco, Ernest Franco Date of Birth:   [birth year] Identification Number:   MRN - [id number] Date of Service:   12/27/2024 20:17:46   Addendum:  CTA head and neck showed no large vessel occlusion or significant stenosis.  Pt is not a candidate for iv-thrombolytics. NO changes in recommendations.   Sari Daring, M.D.  Neurology   Diagnosis:       R47.81 - Slurred speech  Impression PT is a 53 yo male with hx of DM, THN, currently well controlled and not on any medications who presents to the ED with slurred speech. Pt was outside the window for iv-thrombolytics. CT head and CTA head and neck is pending, will follow-up with the results  If non-contast CT/CTA head and neck is unremarkable, recommend admission for stroke work-up including MRI brain without contrast, TTE (with bubble study if available), telemetry lipid panel, hemoglobin AIC   Can give ASA 325 mg x 1 in the ED once the pt passes a dysphagia screen followed by ASA 81 mg daily   Recommendations: Our recommendations are outlined below.  Diagnostic Studies : MRI head without contrast TTE (with bubble study iv available)  Laboratory Studies : Lipid panel Hemoglobin A1c  Antithrombotic Medication : Aspirin  81 mg PO daily Permissive hypertension, Antihypertensives with prn for first 24-48 hrs post stroke onset. If BP greater than 220/120 give Labetalol IV or Vasotec IV Statins for LDL goal less than 70  Nursing Recommendations : IV Fluids, avoid dextrose  containing fluids, Maintain euglycemia Neuro checks q4 hrs x 24 hrs and then per shift Head of bed 30 degrees Continue with Telemetry  Consultations : Recommend Speech therapy if failed dysphagia screen Physical therapy/Occupational therapy  Disposition : Neurology will  follow    ----------------------------------------------------------------------------------------------------    Metrics: Callback Response Time: 12/27/2024 20:18:20  Primary Provider Notified of Diagnostic Impression and Management Plan on: 12/27/2024 21:02:20     ----------------------------------------------------------------------------------------------------  Chief Complaint: Slurred speech  History of Present Illness: Patient is a 53 year old Male. PT is a 53 yo male with hx of DM, THN, currently well controlled and not on any medications who presents to the ED with slurred speech. PT reports that he took at nap at 13:00. When he woke up in the evening, he noticed slurred speech. He has some slight dizziness, but no headache, visual changes, weakness, numbness, difficulty walking.   Past Medical History:      Hypertension      Diabetes Mellitus  Medications:  No Anticoagulant use  No Antiplatelet use Reviewed EMR for current medications  Allergies:  Reviewed  Social History: Smoking: No  Family History:  There is no family history of premature cerebrovascular disease pertinent to this consultation  ROS : 14 Points Review of Systems was performed and was negative except mentioned in HPI.  Past Surgical History: There Is No Surgical History Contributory To Todays Visit   Examination: BP(124/78), Pulse(96), 1A: Level of Consciousness - Alert; keenly responsive + 0 1B: Ask Month and Age - Both Questions Right + 0 1C: Blink Eyes & Squeeze Hands - Performs Both Tasks + 0 2: Test Horizontal Extraocular Movements - Normal + 0 3: Test Visual Fields - No Visual Loss + 0 4: Test Facial Palsy (Use Grimace if Obtunded) - Normal symmetry + 0 5A: Test Left Arm Motor Drift - No Drift for 10 Seconds + 0 5B: Test Right Arm Motor Drift - No Drift for  10 Seconds + 0 6A: Test Left Leg Motor Drift - No Drift for 5 Seconds + 0 6B: Test Right Leg Motor Drift - No  Drift for 5 Seconds + 0 7: Test Limb Ataxia (FNF/Heel-Shin) - No Ataxia + 0 8: Test Sensation - Normal; No sensory loss + 0 9: Test Language/Aphasia - Normal; No aphasia + 0 10: Test Dysarthria - Normal + 0 11: Test Extinction/Inattention - No abnormality + 0  NIHSS Score: 0 NIHSS Free Text : PT reports speech is still slurred but there is no obvius dysarthria.  Spoke with : Dr Alm Cave    This consult was conducted in real time using interactive audio and video technology. Patient was informed of the technology being used for this visit and agreed to proceed. Patient located in hospital and provider located at home/office setting.   Patient is being evaluated for possible acute neurologic impairment and high probability of imminent or life - threatening deterioration.I spent total of 35 minutes providing care to this patient, including time for face to face visit via telemedicine, review of medical records, imaging studies and discussion of findings with providers, the patient and / or family.   Dr Sari Daring     [facility] For Inpatient follow-up with [facility] physician please call [facility] at [phone]. As we are not an outpatient service for any post hospital discharge needs please contact the hospital for assistance.  If you have any questions for the [facility] physicians or need to reconsult for clinical or diagnostic changes please contact us  via [facility] at [phone].  Non-radiologist review of imaging performed to assist with emergent clinical decision-making. Remote physician workstations do not possess the same resolution, calibration, or diagnostic capabilities as hospital-based radiology reading stations, and formal radiologist read is necessary.   Signature : Sari Daring

## 2024-12-27 NOTE — ED Provider Notes (Signed)
 " [place] Provider Note   CSN: [id number] Arrival date & time: 12/27/24  1952     Patient presents with: Dizziness   Ernest Franco is a 53 y.o. male hx of depression, HTN, DM, here with slurred speech and dizziness. Patient hasn't been feeling well since yesterday.  He has been having cold and congestion.  Patient has been taking over-the-counter cough medicine.  Patient also was put on Geodon by his primary care doctor about a week ago for depression.  Patient has been sleeping on and off today.  Last nap started around 2 PM and woke up around 6:45 PM.  Wife noticed that his speech was slurred.  He is also feeling lightheaded and dizzy.  Denies any blurry vision and no history of stroke in the past.   The history is provided by the patient.       Prior to Admission medications  Medication Sig Start Date End Date Taking? Authorizing Provider  buPROPion  (WELLBUTRIN  XL) 300 MG 24 hr tablet Take 300 mg by mouth daily.    [provider]  furosemide  (LASIX ) 20 MG tablet Take 40 mg by mouth as needed.    [provider]  omeprazole  (PRILOSEC  OTC) 20 MG tablet Take 40 mg by mouth daily.    [provider]  sertraline  (ZOLOFT ) 50 MG tablet Take 100 mg by mouth daily. 12/04/17   [provider]    Allergies: Metformin  and related and Other    Review of Systems  Neurological:  Positive for dizziness.  All other systems reviewed and are negative.   Updated Vital Signs BP 124/78 (BP Location: Right Arm)   Pulse 96   Temp 98 F (36.7 C)   Resp 16   Ht 5' 6 (1.676 m)   Wt 127 kg   SpO2 98%   BMI 45.19 kg/m   Physical Exam Vitals and nursing note reviewed.  Constitutional:      Comments: Dehydrated and chronically ill.  HENT:     Head: Normocephalic.     Nose: Nose normal.     Mouth/Throat:     Mouth: Mucous membranes are dry.  Eyes:     Extraocular Movements: Extraocular movements intact.      Pupils: Pupils are equal, round, and reactive to light.  Cardiovascular:     Rate and Rhythm: Normal rate and regular rhythm.     Pulses: Normal pulses.     Heart sounds: Normal heart sounds.  Pulmonary:     Effort: Pulmonary effort is normal.     Breath sounds: Normal breath sounds.  Abdominal:     General: Abdomen is flat.     Palpations: Abdomen is soft.  Musculoskeletal:        General: Normal range of motion.     Cervical back: Normal range of motion and neck supple.  Skin:    General: Skin is warm.  Neurological:     Mental Status: He is alert.     Comments: Patient has some slurred speech.  No obvious facial droop.  Patient has no obvious visual field deficit.  Patient has normal strength bilateral arms and legs and normal finger-to-nose bilaterally.  Psychiatric:        Mood and Affect: Mood normal.        Behavior: Behavior normal.     (all labs ordered are listed, but only abnormal results are displayed) Labs Reviewed  RESP PANEL BY RT-PCR (RSV, FLU A&B,  COVID)  [id number]  PROTIME-INR  APTT  CBC  DIFFERENTIAL  COMPREHENSIVE METABOLIC PANEL WITH GFR  URINE DRUG SCREEN  CBG MONITORING, ED    EKG: EKG Interpretation Date/Time:  Sunday December 27 2024 20:04:14 EST Ventricular Rate:  93 PR Interval:  160 QRS Duration:  79 QT Interval:  361 QTC Calculation: 449 R Axis:   6  Text Interpretation: Sinus rhythm Low voltage, precordial leads Borderline T abnormalities, inferior leads No significant change since last tracing Confirmed by Patt Alm DEL [id number]) on 12/27/2024 8:14:55 PM  Radiology: No results found.   Procedures   Medications Ordered in the ED  sodium chloride  0.9 % bolus 1,000 mL (has no administration in time range)                                    Medical Decision Making Ernest Franco is a 53 y.o. male here presenting with dizziness and trouble speaking.  Patient had some slurred speech but no obvious facial droop and no other  deficits.  I wonder if this is secondary to polypharmacy from taking increased antidepressants and cough medicine.  Also consider LVO or stroke or head bleed.  Plan to get CTA head and neck.  Patient is outside of window for code stroke.  Will consult teleneurology and check labs and flu test and chest x-ray and urinalysis.  Will hydrate patient and reassess  10:39 PM Reviewed patient's labs and they were unremarkable.  COVID flu RSV are negative.  CTA is reviewed by teleneurology and showed no LVO.  Neurology recommend admission for TIA workup.  I notified neurology at [facility], Dr. Elisabeth, to follow-up with patient   Problems Addressed: Slurred speech: acute illness or injury  Amount and/or Complexity of Data Reviewed Labs: ordered. Decision-making details documented in ED Course. Radiology: ordered and independent interpretation performed. Decision-making details documented in ED Course.  Risk Prescription drug management. Decision regarding hospitalization.     Final diagnoses:  None    ED Discharge Orders     None          Patt Alm Macho, MD 12/27/24 2338  "

## 2024-12-27 NOTE — ED Triage Notes (Signed)
 Pt's wife reports she noticed pt had slurred speech at 1845; LKW was 1400; pt c/o dizziness since about 1845; has been taking OTC meds for cold/sinus sxs and wonders if his antidepressant mixed with those is causing sxs; pt A &O x 4, no weaknesses or deficits noted, no facial droop; speech is slow

## 2024-12-27 NOTE — Progress Notes (Signed)
 Plan of Care Note for accepted transfer   Patient: Ernest Franco MRN: [id number]   DOA: 12/27/2024  Facility requesting transfer: [facility]   Requesting Provider: Dr. Patt  Reason for transfer: CVA workup   Facility course: 53  yr old man with hx of depression, anxiety, DM, and HTN presents with slurred speech and dizziness.   He is afebrile with stable vitals. Basic labs are unremarkable. CTA head and neck not yet read but reviewed by teleneurology who recommends admission for stroke workup.   Plan of care: The patient is accepted for admission to Telemetry unit, at [facility].   Author: Evalene GORMAN Sprinkles, MD 12/27/2024  Check [email or web address] for on-call coverage.  Nursing staff, Please call [facility] Admits & Consults System-Wide number on Amion as soon as patient's arrival, so appropriate admitting provider can evaluate the pt.

## 2024-12-28 ENCOUNTER — Observation Stay (HOSPITAL_COMMUNITY): Admitting: Certified Registered Nurse Anesthetist

## 2024-12-28 ENCOUNTER — Other Ambulatory Visit: Payer: Self-pay

## 2024-12-28 ENCOUNTER — Encounter (HOSPITAL_COMMUNITY): Payer: Self-pay | Admitting: Family Medicine

## 2024-12-28 ENCOUNTER — Observation Stay (HOSPITAL_COMMUNITY)

## 2024-12-28 ENCOUNTER — Encounter (HOSPITAL_COMMUNITY)
Admission: EM | Disposition: A | Discharge status: Home / Self Care | Payer: Self-pay | Source: Home / Self Care | Attending: Emergency Medicine

## 2024-12-28 DIAGNOSIS — I739 Peripheral vascular disease, unspecified: Secondary | ICD-10-CM | POA: Diagnosis not present

## 2024-12-28 DIAGNOSIS — R4701 Aphasia: Secondary | ICD-10-CM | POA: Diagnosis not present

## 2024-12-28 DIAGNOSIS — G459 Transient cerebral ischemic attack, unspecified: Secondary | ICD-10-CM

## 2024-12-28 DIAGNOSIS — F418 Other specified anxiety disorders: Secondary | ICD-10-CM | POA: Diagnosis not present

## 2024-12-28 DIAGNOSIS — E119 Type 2 diabetes mellitus without complications: Secondary | ICD-10-CM

## 2024-12-28 DIAGNOSIS — I639 Cerebral infarction, unspecified: Secondary | ICD-10-CM

## 2024-12-28 DIAGNOSIS — I10 Essential (primary) hypertension: Secondary | ICD-10-CM | POA: Diagnosis not present

## 2024-12-28 DIAGNOSIS — T43595A Adverse effect of other antipsychotics and neuroleptics, initial encounter: Secondary | ICD-10-CM | POA: Diagnosis not present

## 2024-12-28 HISTORY — PX: RADIOLOGY WITH ANESTHESIA: SHX6223

## 2024-12-28 LAB — HEMOGLOBIN A1C
Hgb A1c MFr Bld: 5 % (ref 4.8–5.6)
Mean Plasma Glucose: 96.8 mg/dL

## 2024-12-28 LAB — TSH: TSH: 3.52 u[IU]/mL (ref 0.350–4.500)

## 2024-12-28 LAB — GLUCOSE, CAPILLARY
Glucose-Capillary: 79 mg/dL (ref 70–99)
Glucose-Capillary: 94 mg/dL (ref 70–99)
Glucose-Capillary: 103 mg/dL — ABNORMAL HIGH (ref 70–99)
Glucose-Capillary: 219 mg/dL — ABNORMAL HIGH (ref 70–99)

## 2024-12-28 LAB — TROPONIN T, HIGH SENSITIVITY
Troponin T High Sensitivity: <15 ng/L (ref 0–19)
Troponin T High Sensitivity: <15 ng/L (ref 0–19)

## 2024-12-28 LAB — HIV ANTIBODY (ROUTINE TESTING W REFLEX): HIV Screen 4th Generation wRfx: NONREACTIVE

## 2024-12-28 LAB — T4, FREE: Free T4: 0.87 ng/dL (ref 0.80–2.00)

## 2024-12-28 LAB — D-DIMER, QUANTITATIVE: D-Dimer, Quant: 1.16 ug{FEU}/mL — ABNORMAL HIGH (ref 0.00–0.50)

## 2024-12-28 MED ORDER — ORAL CARE MOUTH RINSE: 15.0000 mL | Freq: Once | OROMUCOSAL | Status: AC

## 2024-12-28 MED ORDER — FENTANYL CITRATE (PF) 100 MCG/2ML IJ SOLN
INTRAMUSCULAR | Status: AC
  Filled 2024-12-28: qty 2

## 2024-12-28 MED ORDER — ENOXAPARIN SODIUM 40 MG/0.4ML IJ SOSY
40.0000 mg | PREFILLED_SYRINGE | INTRAMUSCULAR | Status: DC
  Administered 2024-12-28: 40 mg via SUBCUTANEOUS
  Filled 2024-12-28: qty 0.4

## 2024-12-28 MED ORDER — ACETAMINOPHEN 160 MG/5ML PO SOLN: 650.0000 mg | ORAL | Status: DC | PRN

## 2024-12-28 MED ORDER — MIDAZOLAM HCL (PF) 2 MG/2ML IJ SOLN
INTRAMUSCULAR | Status: DC | PRN
  Administered 2024-12-28 (×2): 1 mg via INTRAVENOUS

## 2024-12-28 MED ORDER — ROCURONIUM BROMIDE 10 MG/ML (PF) SYRINGE
PREFILLED_SYRINGE | INTRAVENOUS | Status: DC | PRN
  Administered 2024-12-28: 50 mg via INTRAVENOUS

## 2024-12-28 MED ORDER — AMISULPRIDE (ANTIEMETIC) 5 MG/2ML IV SOLN: 10.0000 mg | Freq: Once | INTRAVENOUS | Status: DC | PRN

## 2024-12-28 MED ORDER — LIDOCAINE HCL (CARDIAC) PF 100 MG/5ML IV SOSY
PREFILLED_SYRINGE | INTRAVENOUS | Status: DC | PRN
  Administered 2024-12-28: 50 mg via INTRAVENOUS

## 2024-12-28 MED ORDER — SODIUM CHLORIDE 0.9 % IV SOLN: 12.5000 mg | INTRAVENOUS | Status: DC | PRN

## 2024-12-28 MED ORDER — LORAZEPAM 2 MG/ML IJ SOLN
1.0000 mg | Freq: Once | INTRAMUSCULAR | Status: AC
  Administered 2024-12-28: 1 mg via INTRAVENOUS
  Filled 2024-12-28: qty 1

## 2024-12-28 MED ORDER — LACTATED RINGERS IV SOLN: INTRAVENOUS | Status: DC

## 2024-12-28 MED ORDER — ACETAMINOPHEN 650 MG RE SUPP: 650.0000 mg | RECTAL | Status: DC | PRN

## 2024-12-28 MED ORDER — ACETAMINOPHEN 325 MG PO TABS
650.0000 mg | ORAL_TABLET | ORAL | Status: DC | PRN
  Administered 2024-12-28: 650 mg via ORAL
  Filled 2024-12-28: qty 2

## 2024-12-28 MED ORDER — MIDAZOLAM HCL 2 MG/2ML IJ SOLN
INTRAMUSCULAR | Status: AC
  Filled 2024-12-28: qty 2

## 2024-12-28 MED ORDER — PROPOFOL 10 MG/ML IV BOLUS
INTRAVENOUS | Status: DC | PRN
  Administered 2024-12-28: 200 mg via INTRAVENOUS

## 2024-12-28 MED ORDER — HYDRALAZINE HCL 20 MG/ML IJ SOLN: 10.0000 mg | Freq: Four times a day (QID) | INTRAMUSCULAR | Status: DC | PRN

## 2024-12-28 MED ORDER — ASPIRIN 81 MG PO TBEC
81.0000 mg | DELAYED_RELEASE_TABLET | Freq: Every day | ORAL | Status: DC
  Administered 2024-12-28: 81 mg via ORAL
  Filled 2024-12-28: qty 1

## 2024-12-28 MED ORDER — INSULIN ASPART 100 UNIT/ML IJ SOLN
0.0000 [IU] | INTRAMUSCULAR | Status: DC
  Administered 2024-12-28: 5 [IU] via SUBCUTANEOUS
  Administered 2024-12-29 (×2): 2 [IU] via SUBCUTANEOUS
  Filled 2024-12-28 (×2): qty 2
  Filled 2024-12-28: qty 5

## 2024-12-28 MED ORDER — SODIUM CHLORIDE 0.9 % IV SOLN: Freq: Once | INTRAVENOUS | Status: AC

## 2024-12-28 MED ORDER — PHENYLEPHRINE 80 MCG/ML (10ML) SYRINGE FOR IV PUSH (FOR BLOOD PRESSURE SUPPORT)
PREFILLED_SYRINGE | INTRAVENOUS | Status: DC | PRN
  Administered 2024-12-28 (×2): 160 ug via INTRAVENOUS

## 2024-12-28 MED ORDER — FENTANYL CITRATE (PF) 100 MCG/2ML IJ SOLN
INTRAMUSCULAR | Status: DC | PRN
  Administered 2024-12-28 (×2): 50 ug via INTRAVENOUS

## 2024-12-28 MED ORDER — SUGAMMADEX SODIUM 200 MG/2ML IV SOLN
INTRAVENOUS | Status: DC | PRN
  Administered 2024-12-28 (×2): 200 mg via INTRAVENOUS

## 2024-12-28 MED ORDER — DEXAMETHASONE SOD PHOSPHATE PF 10 MG/ML IJ SOLN
INTRAMUSCULAR | Status: DC | PRN
  Administered 2024-12-28: 10 mg via INTRAVENOUS

## 2024-12-28 MED ORDER — ONDANSETRON HCL 4 MG/2ML IJ SOLN
INTRAMUSCULAR | Status: DC | PRN
  Administered 2024-12-28: 4 mg via INTRAVENOUS

## 2024-12-28 MED ORDER — STROKE: EARLY STAGES OF RECOVERY BOOK
Freq: Once | Status: AC
  Filled 2024-12-28: qty 1

## 2024-12-28 MED ORDER — CHLORHEXIDINE GLUCONATE 0.12 % MT SOLN
15.0000 mL | Freq: Once | OROMUCOSAL | Status: AC
  Administered 2024-12-28: 15 mL via OROMUCOSAL

## 2024-12-28 NOTE — Plan of Care (Signed)
" °  Problem: Clinical Measurements: Goal: Diagnostic test results will improve Outcome: Progressing   Problem: Self-Care: Goal: Ability to participate in self-care as condition permits will improve Outcome: Progressing   Problem: Nutrition: Goal: Risk of aspiration will decrease Outcome: Progressing   "

## 2024-12-28 NOTE — Anesthesia Procedure Notes (Signed)
 Procedure Name: Intubation Date/Time: 12/28/2024 2:35 PM  Performed by: Cindie Donald CROME, CRNAPre-anesthesia Checklist: Patient identified, Emergency Drugs available, Suction available and Patient being monitored Patient Re-evaluated:Patient Re-evaluated prior to induction Oxygen Delivery Method: Circle System Utilized Preoxygenation: Pre-oxygenation with 100% oxygen Induction Type: IV induction Ventilation: Mask ventilation without difficulty and Oral airway inserted - appropriate to patient size Laryngoscope Size: Glidescope and 4 Grade View: Grade I Tube type: Oral Tube size: 7.5 mm Number of attempts: 1 Airway Equipment and Method: Stylet and Oral airway Placement Confirmation: ETT inserted through vocal cords under direct vision, positive ETCO2 and breath sounds checked- equal and bilateral Secured at: 23 cm Tube secured with: Tape Dental Injury: Teeth and Oropharynx as per pre-operative assessment

## 2024-12-28 NOTE — H&P (Signed)
 " History and Physical    Patient: Ernest Franco FMW:[id number] DOB: 1971-02-28 DOA: 12/27/2024 DOS: the patient was seen and examined on 12/28/2024 . PCP: Duwayne Katheryn HERO, PA  Patient coming from: [facility] Chief complaint: Chief Complaint  Patient presents with   Dizziness   HPI:  Ernest Franco is a 53 y.o. male with past medical history  of  obesity with bmi of 45.19, history of diabetes mellitus type 2, history of secondhand tobacco exposure from both parents during childhood, history of neck surgery coming from [facility] for slurred speech that he noticed yesterday at 630 at home and wife noticed that he was somewhat dizzy and off balance and slurred speech.  They immediately went to [facility] and were evaluated.  Patient states as soon as he received IV fluids at [facility] started feeling better.  ED Course:  Vital signs in the ED were notable for the following:  Vitals:   12/28/24 0645 12/28/24 0658 12/28/24 1015 12/28/24 1213  BP: (!) 153/92  120/75 124/82  Pulse: 71  77 85  Temp:  98.3 F (36.8 C) 98.3 F (36.8 C) 98.4 F (36.9 C)  Resp: (!) 23  20 16   Height:      Weight:      SpO2: 100%  100% 96%  TempSrc:  Oral  Oral  BMI (Calculated):       >>ED evaluation thus far shows: Initial EKG showed sinus rhythm 93 PR 160 QTc 449 nonspecific T wave inversion in inferior leads. Code stroke activated and patient seen by neurologist along with CT/CTA head and neck ordered and was negative for LVO and any acute IC abnormality. Negative chest xray.   >>While in the ED patient received the following: Medications  sodium chloride  0.9 % bolus 1,000 mL (0 mLs Intravenous Stopped 12/27/24 2331)  iohexol  (OMNIPAQUE ) 350 MG/ML injection 75 mL (75 mLs Intravenous Contrast Given 12/27/24 2130)  LORazepam  (ATIVAN ) injection 1 mg (1 mg Intravenous Given 12/28/24 1025)   Review of Systems  Constitutional:  Positive for malaise/fatigue.  Neurological:  Positive for dizziness and  speech change.   Past Medical History:  Diagnosis Date   Anxiety    Chronic back pain    Complication of anesthesia    Depression    Diabetes mellitus without complication (HCC)    Type II, no meds now after gastric sleeve   GERD (gastroesophageal reflux disease)    H/O gastric sleeve 2021   Headache    2017- headache - hospitalized- to rule out stroke   Morbid obesity (HCC)    PONV (postoperative nausea and vomiting)    after MRI   Sleep apnea    none since gastric sleeve   Past Surgical History:  Procedure Laterality Date   CHOLECYSTECTOMY     COLONOSCOPY     INCISION AND DRAINAGE ABSCESS N/A 01/06/2018   Procedure: INCISION AND DRAINAGE OF POSTERIOR NECK ABSCESS;  Surgeon: Belinda Cough, MD;  Location: [facility] OR;  Service: General;  Laterality: N/A;   KNEE SURGERY Right    ACL   LAPAROSCOPIC GASTRIC SLEEVE RESECTION  2021   RADIOLOGY WITH ANESTHESIA N/A 03/27/2018   Procedure: MRI WITH THORACIC SPINE WITHOUT CONTRAST;  Surgeon: Radiologist, Medication, MD;  Location: [facility] OR;  Service: Radiology;  Laterality: N/A;   RADIOLOGY WITH ANESTHESIA N/A 10/02/2018   Procedure: MRI WITH ANESTHESIA OF THORACIC SPINE WITH AND WITHOUT;  Surgeon: Radiologist, Medication, MD;  Location: [facility] OR;  Service: Radiology;  Laterality: N/A;  THORACIC DISCECTOMY Right 06/30/2018   Procedure: Microdiscectomy - right - Thoracic eleven-Thoracic twelve;  Surgeon: Louis Shove, MD;  [facility];  Service: Neurosurgery;  Laterality: Right;   WISDOM TOOTH EXTRACTION      reports that he has never smoked. He has never used smokeless tobacco. He reports current alcohol use. He reports that he does not use drugs. Allergies[1] Family History  Problem Relation Age of Onset   Diabetes Mellitus II Mother    Prior to Admission medications  Medication Sig Start Date End Date Taking? Authorizing Provider  ARIPiprazole (ABILIFY) 5 MG tablet Take 5 mg by mouth daily. 10/21/24  Yes [provider]   busPIRone (BUSPAR) 10 MG tablet Take 10 mg by mouth 3 (three) times daily. 12/01/24  Yes [provider]  DULoxetine (CYMBALTA) 60 MG capsule Take 60 mg by mouth 2 (two) times daily. 11/12/22  Yes [provider]  MOUNJARO 10 MG/0.5ML Pen Inject 10 mg into the skin once a week. 12/11/24  Yes [provider]  TRULICITY 4.5 MG/0.5ML SOAJ Inject 0.5 mLs into the skin once a week. 11/04/24  Yes [provider]  ziprasidone (GEODON) 20 MG capsule Take 20 mg by mouth at bedtime. 12/17/24 03/17/25 Yes [provider]  buPROPion  (WELLBUTRIN  XL) 150 MG 24 hr tablet Take 150 mg by mouth every morning.    [provider]  buPROPion  (WELLBUTRIN  XL) 300 MG 24 hr tablet Take 300 mg by mouth daily.    [provider]  furosemide  (LASIX ) 20 MG tablet Take 40 mg by mouth as needed.    [provider]  omeprazole  (PRILOSEC  OTC) 20 MG tablet Take 40 mg by mouth daily.    [provider]  sertraline  (ZOLOFT ) 50 MG tablet Take 100 mg by mouth daily. 12/04/17   [provider]                                                                                 Vitals:   12/28/24 0645 12/28/24 0658 12/28/24 1015 12/28/24 1213  BP: (!) 153/92  120/75 124/82  Pulse: 71  77 85  Resp: (!) 23  20 16   Temp:  98.3 F (36.8 C) 98.3 F (36.8 C) 98.4 F (36.9 C)  TempSrc:  Oral  Oral  SpO2: 100%  100% 96%  Weight:      Height:       Physical Exam Vitals and nursing note reviewed.  Constitutional:      General: He is awake. He is not in acute distress.    Appearance: He is obese. He is not ill-appearing.  HENT:     Head: Normocephalic and atraumatic.     Right Ear: Hearing normal.     Left Ear: Hearing normal.     Nose: No nasal deformity.     Mouth/Throat:     Lips: Pink.     Mouth: Mucous membranes are dry.     Tongue: No lesions. Tongue does not deviate from midline.     Pharynx: Oropharynx is clear.  Eyes:     General:  Lids are normal.     Extraocular Movements: Extraocular movements intact.  Right eye: Normal extraocular motion.     Left eye: Normal extraocular motion.     Pupils: Pupils are equal, round, and reactive to light.  Neck:     Vascular: No carotid bruit or JVD.  Cardiovascular:     Rate and Rhythm: Normal rate and regular rhythm.     Pulses:          Dorsalis pedis pulses are 2+ on the right side and 2+ on the left side.       Posterior tibial pulses are 2+ on the right side and 2+ on the left side.     Heart sounds: Normal heart sounds.  Pulmonary:     Effort: Pulmonary effort is normal.     Breath sounds: Normal breath sounds.  Abdominal:     General: Bowel sounds are normal. There is no distension.     Palpations: Abdomen is soft. There is no mass.     Tenderness: There is no abdominal tenderness.  Musculoskeletal:        General: No swelling.     Cervical back: Normal range of motion. No pain with movement. Normal range of motion.     Right lower leg: No edema.     Left lower leg: No edema.  Skin:    General: Skin is warm.  Neurological:     General: No focal deficit present.     Mental Status: He is alert and oriented to person, place, and time.     Cranial Nerves: Cranial nerves 2-12 are intact. No cranial nerve deficit, dysarthria or facial asymmetry.     Sensory: Sensation is intact.     Motor: Motor function is intact. No weakness or tremor.     Coordination: Coordination normal. Finger-Nose-Finger Test and Heel to [facility] Test normal.     Deep Tendon Reflexes:     Reflex Scores:      Bicep reflexes are 2+ on the right side and 2+ on the left side.      Patellar reflexes are 2+ on the right side and 2+ on the left side. Psychiatric:        Speech: Speech normal.     Labs on Admission: I have personally reviewed following labs and imaging studies CBC: Recent Labs  Lab 12/27/24 2012  WBC 8.0  NEUTROABS 5.1  HGB 14.7  HCT 41.2  MCV 86.4  PLT 188   Basic  Metabolic Panel: Recent Labs  Lab 12/27/24 2012  NA 140  K 3.7  CL 107  CO2 24  GLUCOSE 126*  BUN 14  CREATININE 0.87  CALCIUM  8.1*   GFR: Estimated Creatinine Clearance: 120.9 mL/min (by C-G formula based on SCr of 0.87 mg/dL). Liver Function Tests: Recent Labs  Lab 12/27/24 2012  AST 18  ALT 13  ALKPHOS 54  BILITOT 0.2  PROT 5.4*  ALBUMIN 3.5   No results for input(s): LIPASE, AMYLASE in the last 168 hours. No results for input(s): AMMONIA in the last 168 hours. Recent Labs    12/27/24 2012  BUN 14  CREATININE 0.87    Cardiac Enzymes: No results for input(s): CKTOTAL, CKMB, CKMBINDEX, TROPONINI in the last 168 hours. BNP (last 3 results) No results for input(s): PROBNP in the last 8760 hours. HbA1C: No results for input(s): HGBA1C in the last 72 hours. CBG: Recent Labs  Lab 12/27/24 2024  GLUCAP 110*   Lipid Profile: No results for input(s): CHOL, HDL, LDLCALC, TRIG, CHOLHDL, LDLDIRECT in the last 72 hours. Thyroid  Function Tests: No results for input(s): TSH, T4TOTAL, FREET4, T3FREE, THYROIDAB in the last 72 hours. Anemia Panel: No results for input(s): VITAMINB12, FOLATE, FERRITIN, TIBC, IRON, RETICCTPCT in the last 72 hours. Urine analysis:    Component Value Date/Time   COLORURINE YELLOW 12/27/2024 2149   APPEARANCEUR CLEAR 12/27/2024 2149   LABSPEC 1.015 12/27/2024 2149   PHURINE 6.0 12/27/2024 2149   GLUCOSEU NEGATIVE 12/27/2024 2149   HGBUR NEGATIVE 12/27/2024 2149   BILIRUBINUR NEGATIVE 12/27/2024 2149   KETONESUR NEGATIVE 12/27/2024 2149   PROTEINUR NEGATIVE 12/27/2024 2149   NITRITE NEGATIVE 12/27/2024 2149   LEUKOCYTESUR NEGATIVE 12/27/2024 2149   Radiological Exams on Admission: CT ANGIO HEAD NECK W WO CM Result Date: 12/27/2024 EXAM: CTA Head and Neck with Intravenous Contrast. CT Head without Contrast. CLINICAL HISTORY: Neuro deficit, acute, stroke suspected TECHNIQUE: Axial CTA  images of the head and neck performed with intravenous contrast. MIP reconstructed images were created and reviewed. Axial computed tomography images of the head/brain performed without intravenous contrast. Note: Per PQRS, the description of internal carotid artery percent stenosis, including 0 percent or normal exam, is based on [employer] American Symptomatic Carotid Endarterectomy Trial (NASCET) criteria. Dose reduction technique was used including one or more of the following: automated exposure control, adjustment of mA and kV according to patient size, and/or iterative reconstruction. CONTRAST: 75 mL Omnipaque  COMPARISON: CTA head/neck 10/03/2016. FINDINGS: CT HEAD: CT head is limited by beam hardening artifact. Within this limitation: BRAIN: No acute intraparenchymal hemorrhage. No mass lesion. No CT evidence for acute territorial infarct. No midline shift or extra-axial collection. VENTRICLES: No hydrocephalus. ORBITS: The orbits are unremarkable. SINUSES AND MASTOIDS: The paranasal sinuses and mastoid air cells are clear. CTA NECK: COMMON CAROTID ARTERIES: No significant stenosis. No dissection or occlusion. INTERNAL CAROTID ARTERIES: No stenosis by NASCET criteria. No dissection or occlusion. VERTEBRAL ARTERIES: No significant stenosis. No dissection or occlusion. CTA HEAD: ANTERIOR CEREBRAL ARTERIES: No significant stenosis. No occlusion. No aneurysm. MIDDLE CEREBRAL ARTERIES: No significant stenosis. No occlusion. No aneurysm. POSTERIOR CEREBRAL ARTERIES: Bilateral fetal type PCAs with small vertebrobasilar system, anatomic variant. No significant stenosis. No occlusion. No aneurysm. BASILAR ARTERY: No significant stenosis. No occlusion. No aneurysm. OTHER: SOFT TISSUES: No acute finding. No masses or lymphadenopathy. BONES: No acute osseous abnormality. IMPRESSION: 1. No large vessel occlusion or significant stenosis. 2. No evidence of acute intracranial abnormality on limited CT head. Electronically signed  by: Gilmore Molt 12/27/2024 11:00 PM EST RP Workstation: [id number]   DG Chest Port 1 View Result Date: 12/27/2024 EXAM: 1 VIEW(S) XRAY OF THE CHEST 12/27/2024 08:22:00 PM COMPARISON: CXR 10/03/2016. CLINICAL HISTORY: AMS, confusion. FINDINGS: LUNGS AND PLEURA: No focal pulmonary opacity. No pleural effusion. No pneumothorax. HEART AND MEDIASTINUM: Mild aortic arch calcification. BONES AND SOFT TISSUES: No acute osseous abnormality. IMPRESSION: 1. No acute findings. Electronically signed by: Morgane Naveau MD 12/27/2024 09:04 PM EST RP Workstation: [id number]   Data Reviewed: Relevant notes from primary care and specialist visits, past discharge summaries as available in EHR, including Care Everywhere . Prior diagnostic testing as pertinent to current admission diagnoses, Updated medications and problem lists for reconciliation .ED course, including vitals, labs, imaging, treatment and response to treatment,Triage notes, nursing and pharmacy notes and ED provider's notes.Notable results as noted in HPI.Discussed case with EDMD/ ED APP/ or Specialty MD on call and as needed.  Assessment & Plan   >>Slurred speech: D/d include TIA/ CVA, metabolic 2/2 to his hyperglycemia.  Admitted to tele with cont cardiac monitoring for  any rhythm abnormality.  - Stroke service consulted. - tPA not given because Out of window. - MRI brain  ordered and pending.  - Aspirin  daily 81 mg.  - Check FLP, hemoglobin A1c. - Check 2 D Echocardiogram . - Gentle ivf hydration.  - RN to perform stroke swallowing screen and if patient passes, place diet order. - Neuro checks  then Q 4 hours. - PT/OT evaluations  >>Obesity: - TFT/A1c.  - Dietician consult.    >>?Htn: Vitals:   12/27/24 2001 12/27/24 2100 12/28/24 0230 12/28/24 0300  BP: 124/78 131/83 124/68 122/78   12/28/24 0345 12/28/24 0645 12/28/24 1015 12/28/24 1213  BP: 103/68 (!) 153/92 120/75 124/82  No meds , goal is permissive HTN for next 24  hours.   DVT prophylaxis:  Lovenox .   Consults:  Neurology.   Advance Care Planning:    Code Status: Full Code   Family Communication:  Wife at bedside.   Disposition Plan:  Home.   Severity of Illness: The appropriate patient status for this patient is OBSERVATION. Observation status is judged to be reasonable and necessary in order to provide the required intensity of service to ensure the patient's safety. The patient's presenting symptoms, physical exam findings, and initial radiographic and laboratory data in the context of their medical condition is felt to place them at decreased risk for further clinical deterioration. Furthermore, it is anticipated that the patient will be medically stable for discharge from the hospital within 2 midnights of admission.   Unresulted Labs (From admission, onward)     Start     Ordered   12/29/24 0500  Lipid panel  (Labs)  Tomorrow morning,   R       Comments: Fasting    12/28/24 1229   12/28/24 1308  D-dimer, quantitative  ONCE - STAT,   STAT        12/28/24 1307   12/28/24 1230  T4, free  Add-on,   AD        12/28/24 1230   12/28/24 1230  TSH  Add-on,   AD        12/28/24 1230   12/28/24 1226  HIV Antibody (routine testing w rflx)  (HIV Antibody (Routine testing w reflex) panel)  Once,   R        12/28/24 1229   12/28/24 1226  Hemoglobin A1c  (Labs)  Once,   R       Comments: To assess prior glycemic control    12/28/24 1229            Meds ordered this encounter  Medications   sodium chloride  0.9 % bolus 1,000 mL   iohexol  (OMNIPAQUE ) 350 MG/ML injection 75 mL   LORazepam  (ATIVAN ) injection 1 mg   enoxaparin  (LOVENOX ) injection 40 mg    stroke: early stages of recovery book   0.9 %  sodium chloride  infusion   OR Linked Order Group    acetaminophen  (TYLENOL ) tablet 650 mg    acetaminophen  (TYLENOL ) 160 MG/5ML solution 650 mg    acetaminophen  (TYLENOL ) suppository 650 mg   aspirin  EC tablet 81 mg   insulin  aspart  (novoLOG ) injection 0-15 Units    Correction coverage::   Moderate (average weight, post-op)    CBG < 70::   Implement Hypoglycemia Standing Orders and refer to Hypoglycemia Standing Orders sidebar report    CBG 70 - 120::   0 units    CBG 121 - 150::   2 units  CBG 151 - 200::   3 units    CBG 201 - 250::   5 units    CBG 251 - 300::   8 units    CBG 301 - 350::   11 units    CBG 351 - 400::   15 units    CBG > 400:   call MD and obtain STAT lab verification   hydrALAZINE (APRESOLINE) injection 10 mg     Orders Placed This Encounter  Procedures   Resp panel by RT-PCR (RSV, Flu A&B, Covid) Anterior Nasal Swab   CT ANGIO HEAD NECK W WO CM   DG Chest Port 1 View   MR BRAIN WO CONTRAST   Protime-INR   APTT   CBC   Differential   Comprehensive metabolic panel   Rapid urine drug screen (hospital performed)   Urinalysis, w/ Reflex to Culture (Infection Suspected) -Urine, Clean Catch   HIV Antibody (routine testing w rflx)   Lipid panel   Hemoglobin A1c   T4, free   TSH   D-dimer, quantitative   Diet NPO time specified   Vital signs q 2 hours x 12 hours, then q 4 hours   ED Cardiac monitoring   NIH Stroke Scale   Swallow screen   Initiate Carrier Fluid Protocol   Nurse notify provider if SBP > 220/120   If O2 sat <94% Administer O2 @ 2 Liters/Minute   Cardiac Monitoring Continuous x 48 hours Indications for use: Acute neurological event   NIHSS score documentation NIHSS score range: 0-42   Patient has an active order for admit to inpatient/place in observation   Vital signs   Notify physician (specify)   OOB with assistance   Activity as tolerated   Swallow screen - If patient does NOT pass this screen, place order for SLP eval and treat (SLP2) - swallowing evaluation (BSE, MBS and/or diet order as indicated)   NIH Stroke Scale   Intake and output   Apply Stroke Care Plan: Ischemic Stroke, TIA   Discuss with patient and document patient's goals for stroke risk factor  reduction   Initiate Oral Care Protocol   Initiate Carrier Fluid Protocol   Provide stroke education material to patient and family.   Nurse to provide smoking / tobacco cessation education   If the patient has passed the Stroke Swallow Screen or has a feeding tube, then RN may order General Admission PRN Orders (through manage orders) for the following patient needs: allergy symptoms (Claritin), cold sores (Carmex), cough (Robitussin DM), eye irritation (Liquifilm Tears), hemorrhoids (Tucks), indigestion (Maalox), minor skin irritation (hydrocortisone cream), muscle pain Lucienne Gay), nose irritation (saline nasal spray) and sore throat (Chloraseptic spray).   Apply Diabetes Mellitus Care Plan   STAT CBG when hypoglycemia is suspected. If treated, recheck every 15 minutes after each treatment until CBG >/= 70 mg/dl   Refer to Hypoglycemia Protocol Sidebar Report for treatment of CBG < 70 mg/dl   Full code   Call/consult TeleNeurology  Consult Timeframe: STAT - requires a response within one hour; STAT timeframe requires provider to provider communication, has the provider to provider communication been completed: Yes; Reason for Consult? trouble spea...   Consult to hospitalist   Consult to Registered Dietitian   Consult to Transition of Care Team   OT eval and treat   PT eval and treat   ED Pulse oximetry, continuous   Oxygen therapy Mode or (Route): Nasal cannula; Liters Per Minute: 2; Keep O2 saturation between:  greater than 94 %   SLP eval and treat Reason for evaluation: Cognitive/Language evaluation   CBG monitoring, ED   EKG 12-Lead   ED EKG   ECHOCARDIOGRAM COMPLETE   Saline lock IV   Place in observation (patient's expected length of stay will be less than 2 midnights)   Fall precautions   Aspiration precautions    Author: Mario LULLA Blanch, MD 12 pm- 8 pm. [facility]. 12/28/2024 1:13 PM Please note for any communication after hours contact [facility] Assigned provider on call on  Amion.       [1]  Allergies Allergen Reactions   Metformin  And Related Diarrhea   Other Nausea And Vomiting    general anesthesia    "

## 2024-12-28 NOTE — Anesthesia Postprocedure Evaluation (Signed)
"   Anesthesia Post Note  Patient: Ernest Franco  Procedure(s) Performed: MRI WITH ANESTHESIA     Patient location during evaluation: PACU Anesthesia Type: General Level of consciousness: awake and alert Pain management: pain level controlled Vital Signs Assessment: post-procedure vital signs reviewed and stable Respiratory status: spontaneous breathing, nonlabored ventilation and respiratory function stable Cardiovascular status: stable and blood pressure returned to baseline Anesthetic complications: no   No notable events documented.  Last Vitals:  Vitals:   12/28/24 1530 12/28/24 1545  BP: (!) 141/90 (!) 141/99  Pulse: 85 88  Resp: 13 12  Temp:  36.9 C  SpO2: 92% 94%    Last Pain:  Vitals:   12/28/24 1545  TempSrc:   PainSc: 0-No pain                 Debby FORBES Like      "

## 2024-12-28 NOTE — Transfer of Care (Signed)
 Immediate Anesthesia Transfer of Care Note  Patient: Ernest Franco  Procedure(s) Performed: MRI WITH ANESTHESIA  Patient Location: PACU  Anesthesia Type:General  Level of Consciousness: awake, alert , oriented, drowsy, and patient cooperative  Airway & Oxygen Therapy: Patient Spontanous Breathing  Post-op Assessment: Report given to RN and Post -op Vital signs reviewed and stable  Post vital signs: Reviewed and stable  Last Vitals:  Vitals Value Taken Time  BP 145/97 12/28/24 15:17  Temp    Pulse 86 12/28/24 15:19  Resp 11 12/28/24 15:19  SpO2 94 % 12/28/24 15:19  Vitals shown include unfiled device data.  Last Pain:  Vitals:   12/28/24 1401  TempSrc:   PainSc: 0-No pain         Complications: No notable events documented.

## 2024-12-28 NOTE — Discharge Instructions (Addendum)
 Counseling Resources:  [facility] [address], [address]  [place], [place] [address]        [phone]     [facility]  [phone] N. [address] [place]  [place], [place] [address] [phone]  [facility] [phone] Ages 36-64. Accepts most insurances and some uninsured.  [facility] [address] [place], [place]  [phone]    [facility] [address] [place], [place] [phone]  [facility]:  [phone] [address] [place]  [facility]:  [address] [place], [place] [phone]  *[facility] (under & uninsured) [address]   [place] [phone]    [email or web address]    [facility] [place] -[address]     Phone:  [phone] [facility]-  [address]  [phone]    [facility] [address] [place], [place] [phone] ext [phone]  *[facility]: [address] [place], [place], [place]   [phone]   [facility] (Spanish therapist) [address] [place]  [address] [place] [address] [phone]    [facility]  [phone]  [address],  [place], [place]    [place] [phone] [address] [place]   [facility]  [address] [place], [place]        [phone]  [facility] [place], [address] [place] [phone]):   Mon - Fri from 8 AM - 3 PM  [facility] [address], [address] , [place]: [phone]) 8:30 - 12; 1 - 2:30 Accepts Medicaid   [facility] [employer], [address] [place], [place] ([phone]):8:30 - 12; 2 - 3PM  [facility] [employer], [address] [place]; [phone]):   Mon - Fri 8 AM - 5 PM  *[facility] [address] [place]  MWF 12:30 to 3:00 or  call to schedule an appointment [phone]  Specific Provider options Psychology Today  [email or web address]://www.psychologytoday.com/us  click on find a therapist  enter your zip code left side and select or tailor a therapist for your specific need.   [facility] Provider Directory [email or web address]  (Medicaid)  Follow all drop down to find a provider  Social Support program [facility] [phone] or photosolver.pl [address], [place], [place] Recovery support and educational  In home counseling [facility] Telephone: [phone]  office in [facility] [email or web address]   private insurance [employer], [place] health Choice, [place], [place], [employer], [place], [place] Health Choice   24- Hour Availability:  [facility]  [phone] or [phone]  [facility] [phone]  [facility]  [phone]   [facility]  [phone] (after hours)  Therapeutic Alternative/Mobile Crisis   [phone]  [place]  National Suicide Hotline  (989)291-5883 [place])  Call 911 or go to emergency room  [facility]  [phone]);  [place] and [place]  [phone]); [place], [place], [place], [place], [place], [place], [place]

## 2024-12-28 NOTE — Anesthesia Preprocedure Evaluation (Addendum)
"                                    Anesthesia Evaluation  Patient identified by MRN, date of birth, ID band Patient awake    Reviewed: Allergy & Precautions, NPO status , Patient's Chart, lab work & pertinent test results  History of Anesthesia Complications (+) PONV and history of anesthetic complications  Airway Mallampati: III  TM Distance: >3 FB Neck ROM: Full    Dental  (+) Dental Advisory Given   Pulmonary sleep apnea    Pulmonary exam normal        Cardiovascular hypertension, Normal cardiovascular exam     Neuro/Psych  Headaches PSYCHIATRIC DISORDERS Anxiety Depression    TIACVA, Residual Symptoms    GI/Hepatic Neg liver ROS,GERD  Medicated and Controlled,, S/p gastric sleeve    Endo/Other  diabetes, Type 2  Class 3 obesity On GLP-1a   Renal/GU negative Renal ROS     Musculoskeletal negative musculoskeletal ROS (+)    Abdominal  (+) + obese  Peds  Hematology negative hematology ROS (+)   Anesthesia Other Findings   Reproductive/Obstetrics                              Anesthesia Physical Anesthesia Plan  ASA: 4  Anesthesia Plan: General   Post-op Pain Management: Minimal or no pain anticipated   Induction: Intravenous  PONV Risk Score and Plan: 3 and Treatment may vary due to age or medical condition, Ondansetron , Dexamethasone  and Midazolam   Airway Management Planned: Oral ETT  Additional Equipment: None  Intra-op Plan:   Post-operative Plan: Extubation in OR  Informed Consent: I have reviewed the patients History and Physical, chart, labs and discussed the procedure including the risks, benefits and alternatives for the proposed anesthesia with the patient or authorized representative who has indicated his/her understanding and acceptance.     Dental advisory given  Plan Discussed with: CRNA and Anesthesiologist  Anesthesia Plan Comments:          Anesthesia Quick Evaluation  "

## 2024-12-28 NOTE — Progress Notes (Addendum)
 STROKE TEAM PROGRESS NOTE    INTERIM HISTORY/SUBJECTIVE  Transferred from [facility] for stroke/TIA workup.   States he had been fighting congestion all weekend, took multiple cold medications. Took geodon to sleep last night around 1800. Acutely was drunk feeling, slurred speech, no aphasia. This was the first time he took Geodon. Wife, at bedside, states he was also staggering on his feet, seeming like he was intoxicated. Started talking better last night around 2200-2300.   PENDING MRI under anesthesia and ECHO.  States he has had OSA in the past, recommend outpatient testing.  OBJECTIVE  CBC    Component Value Date/Time   WBC 8.0 12/27/2024 2012   RBC 4.77 12/27/2024 2012   HGB 14.7 12/27/2024 2012   HCT 41.2 12/27/2024 2012   PLT 188 12/27/2024 2012   MCV 86.4 12/27/2024 2012   MCH 30.8 12/27/2024 2012   MCHC 35.7 12/27/2024 2012   RDW 11.9 12/27/2024 2012   LYMPHSABS 2.1 12/27/2024 2012   MONOABS 0.6 12/27/2024 2012   EOSABS 0.1 12/27/2024 2012   BASOSABS 0.0 12/27/2024 2012    BMET    Component Value Date/Time   NA 140 12/27/2024 2012   K 3.7 12/27/2024 2012   CL 107 12/27/2024 2012   CO2 24 12/27/2024 2012   GLUCOSE 126 (H) 12/27/2024 2012   BUN 14 12/27/2024 2012   CREATININE 0.87 12/27/2024 2012   CALCIUM  8.1 (L) 12/27/2024 2012   GFRNONAA >60 12/27/2024 2012    IMAGING past 24 hours CT ANGIO HEAD NECK W WO CM Result Date: 12/27/2024 EXAM: CTA Head and Neck with Intravenous Contrast. CT Head without Contrast. CLINICAL HISTORY: Neuro deficit, acute, stroke suspected TECHNIQUE: Axial CTA images of the head and neck performed with intravenous contrast. MIP reconstructed images were created and reviewed. Axial computed tomography images of the head/brain performed without intravenous contrast. Note: Per PQRS, the description of internal carotid artery percent stenosis, including 0 percent or normal exam, is based on [employer] American Symptomatic Carotid Endarterectomy  Trial (NASCET) criteria. Dose reduction technique was used including one or more of the following: automated exposure control, adjustment of mA and kV according to patient size, and/or iterative reconstruction. CONTRAST: 75 mL Omnipaque  COMPARISON: CTA head/neck 10/03/2016. FINDINGS: CT HEAD: CT head is limited by beam hardening artifact. Within this limitation: BRAIN: No acute intraparenchymal hemorrhage. No mass lesion. No CT evidence for acute territorial infarct. No midline shift or extra-axial collection. VENTRICLES: No hydrocephalus. ORBITS: The orbits are unremarkable. SINUSES AND MASTOIDS: The paranasal sinuses and mastoid air cells are clear. CTA NECK: COMMON CAROTID ARTERIES: No significant stenosis. No dissection or occlusion. INTERNAL CAROTID ARTERIES: No stenosis by NASCET criteria. No dissection or occlusion. VERTEBRAL ARTERIES: No significant stenosis. No dissection or occlusion. CTA HEAD: ANTERIOR CEREBRAL ARTERIES: No significant stenosis. No occlusion. No aneurysm. MIDDLE CEREBRAL ARTERIES: No significant stenosis. No occlusion. No aneurysm. POSTERIOR CEREBRAL ARTERIES: Bilateral fetal type PCAs with small vertebrobasilar system, anatomic variant. No significant stenosis. No occlusion. No aneurysm. BASILAR ARTERY: No significant stenosis. No occlusion. No aneurysm. OTHER: SOFT TISSUES: No acute finding. No masses or lymphadenopathy. BONES: No acute osseous abnormality. IMPRESSION: 1. No large vessel occlusion or significant stenosis. 2. No evidence of acute intracranial abnormality on limited CT head. Electronically signed by: Gilmore Molt 12/27/2024 11:00 PM EST RP Workstation: [id number]   DG Chest Port 1 View Result Date: 12/27/2024 EXAM: 1 VIEW(S) XRAY OF THE CHEST 12/27/2024 08:22:00 PM COMPARISON: CXR 10/03/2016. CLINICAL HISTORY: AMS, confusion. FINDINGS: LUNGS AND PLEURA: No  focal pulmonary opacity. No pleural effusion. No pneumothorax. HEART AND MEDIASTINUM: Mild aortic arch  calcification. BONES AND SOFT TISSUES: No acute osseous abnormality. IMPRESSION: 1. No acute findings. Electronically signed by: Kate Plummer MD 12/27/2024 09:04 PM EST RP Workstation: [id number]    Vitals:   12/28/24 0645 12/28/24 0658 12/28/24 1015 12/28/24 1213  BP: (!) 153/92  120/75 124/82  Pulse: 71  77 85  Resp: (!) 23  20 16   Temp:  98.3 F (36.8 C) 98.3 F (36.8 C) 98.4 F (36.9 C)  TempSrc:  Oral  Oral  SpO2: 100%  100% 96%  Weight:      Height:        PHYSICAL EXAM General:  Alert, well-nourished, well-developed patient in no acute distress CV: Regular rate and rhythm on monitor Respiratory:  Regular, unlabored respirations on room air  NEURO:  Mental Status: AA&Ox3, patient is able to give clear and coherent history Speech/Language: speech is without dysarthria or aphasia.  Naming, repetition, fluency, and comprehension intact.  Cranial Nerves:  II: PERRL. Visual fields full.  III, IV, VI: EOMI. Eyelids elevate symmetrically.  V: Sensation is intact to light touch and symmetrical to face.  VII: Face is symmetrical resting and smiling VIII: hearing intact to voice. IX, X: Palate elevates symmetrically. Phonation is normal.  KP:Dynloizm shrug 5/5. XII: tongue is midline without fasciculations. Motor: 5/5 strength to all muscle groups tested.  Tone: is normal and bulk is normal Sensation- Intact to light touch bilaterally. Extinction absent to light touch to DSS.   Coordination: FTN intact bilaterally, HKS: no ataxia in BLE.No drift.  Gait- deferred  Most Recent NIH: 0.    ASSESSMENT/PLAN  [name] is a 53 y.o. male with history of HTN, DM, depression who presented to [facility] with slurred speech and dizziness.  Patient states he has been fighting congestion throughout the weekend.  He stayed in bed and slept most of it. After taking multiple cold medications and usual antidepressants, he took new prescription of Geodon to help him sleep around 6 PM  12/28.  After this wife noticed his speech was slurred, unsteady on his feet, lightheaded and dizzy.  Evaluated by teleneurology.  CT head, CTA negative.  Patient transferred to [facility] for TIA/stroke workup.  NIH on Admission to [facility]: 0.  Posterior circulation TIA versus Medication Effects of Geodon CT head No acute abnormality.  CTA head & neck: No LVO. MRI  PENDING (will need to be done and under anesthesia due to patient's severe claustrophobia, NPO currently) 2D Echo PENDING LDL No results found for requested labs within last 1095 days. HgbA1c No results found for requested labs within last 1095 days. VTE prophylaxis - lovenox  No antithrombotic prior to admission, now on aspirin  81 mg daily Therapy recommendations:  Pending Disposition:  pending, likely home 12/30  Hypertension Home meds:  none Stable Blood Pressure Goal: BP less than 220/110 for 24 hours after symptom onset, then gradually normalize.  Hyperlipidemia Home meds:  none LDL No results found for requested labs within last 1095 days., goal < 70 LDL: 99 10/2022  Diabetes type II  Home meds: Mounjaro, Trulicity HgbA1c No results found for requested labs within last 1095 days., goal < 7.0 Previously noted as 4.7 in April 2025 CBGs, SSI Recommend close follow-up with PCP   Other Stroke Risk Factors Obesity, Body mass index is 45.19 kg/m., BMI >/= 30 associated with increased stroke risk, recommend weight loss, diet and exercise as appropriate  Obstructive sleep apnea.  Patient stated he has history of, prior to gastric sleeve surgery. Recommend outpatient testing.  Other Active Problems, per Primary Hx of Depression Home meds: Abilify, Wellbutrin , BuSpar, Cymbalta, Zoloft , Geodon Chronic Back Pain Denied acute pain at this time   Hospital day # 0   Pt seen by Neuro NP/APP with MD. Note/plan to be edited by MD as needed.    Rocky JAYSON Likes, DNP [facility] Please use AMION for contact  information & EPIC for messaging.  I have personally obtained history,examined this patient, reviewed notes, independently viewed imaging studies, participated in medical decision making and plan of care.ROS completed by me personally and pertinent positives fully documented  I have made any additions or clarifications directly to the above note. Agree with note above.  Patient presented with transient dizziness and blurry vision following the first dose of Geodon and lasting for 3 to 4 hours and has not improved back to baseline.  Possibilities include reaction to first dose of new medication versus posterior circulation TIA from small vessel disease.  Neurological exam is nonfocal and CT CT T angiogram of the brain and neck unremarkable.  Continue ongoing stroke workup.  Check MRI scan of the brain.  Mobilize out of bed.  Therapy consults.  Continue aspirin  alone for stroke prevention for now.  Long discussion with patient and wife and answered questions.   I personally spent a total of 50 minutes in the care of the patient today including getting/reviewing separately obtained history, performing a medically appropriate exam/evaluation, counseling and educating, placing orders, referring and communicating with other health care professionals, documenting clinical information in the EHR, independently interpreting results, and coordinating care.         Eather Popp, MD Medical Director [facility] Stroke Center Pager: [phone] 12/28/2024 5:07 PM   To contact Stroke Continuity provider, please refer to [email or web address]. After hours, contact General Neurology

## 2024-12-29 ENCOUNTER — Encounter (HOSPITAL_COMMUNITY): Payer: Self-pay | Admitting: Radiology

## 2024-12-29 ENCOUNTER — Observation Stay (HOSPITAL_BASED_OUTPATIENT_CLINIC_OR_DEPARTMENT_OTHER)

## 2024-12-29 ENCOUNTER — Other Ambulatory Visit (HOSPITAL_COMMUNITY): Payer: Self-pay

## 2024-12-29 ENCOUNTER — Observation Stay (HOSPITAL_COMMUNITY)

## 2024-12-29 DIAGNOSIS — R4701 Aphasia: Secondary | ICD-10-CM | POA: Diagnosis not present

## 2024-12-29 DIAGNOSIS — G459 Transient cerebral ischemic attack, unspecified: Secondary | ICD-10-CM

## 2024-12-29 LAB — ECHOCARDIOGRAM COMPLETE
Height: 66 in
S' Lateral: 2 cm
Weight: 4480 [oz_av]

## 2024-12-29 LAB — LIPID PANEL
Cholesterol: 168 mg/dL (ref 0–200)
HDL: 37 mg/dL — ABNORMAL LOW
LDL Cholesterol: 111 mg/dL — ABNORMAL HIGH (ref 0–99)
Total CHOL/HDL Ratio: 4.6 ratio
Triglycerides: 100 mg/dL
VLDL: 20 mg/dL (ref 0–40)

## 2024-12-29 LAB — GLUCOSE, CAPILLARY
Glucose-Capillary: 103 mg/dL — ABNORMAL HIGH (ref 70–99)
Glucose-Capillary: 107 mg/dL — ABNORMAL HIGH (ref 70–99)
Glucose-Capillary: 143 mg/dL — ABNORMAL HIGH (ref 70–99)
Glucose-Capillary: 148 mg/dL — ABNORMAL HIGH (ref 70–99)

## 2024-12-29 MED ORDER — ATORVASTATIN CALCIUM 40 MG PO TABS
40.0000 mg | ORAL_TABLET | Freq: Every day | ORAL | 0 refills | Status: AC
  Filled 2024-12-29: qty 30, 30d supply, fill #0

## 2024-12-29 MED ORDER — ATORVASTATIN CALCIUM 40 MG PO TABS
40.0000 mg | ORAL_TABLET | Freq: Every day | ORAL | Status: DC
  Administered 2024-12-29: 40 mg via ORAL
  Filled 2024-12-29: qty 1

## 2024-12-29 MED ORDER — IOHEXOL 350 MG/ML SOLN
75.0000 mL | Freq: Once | INTRAVENOUS | Status: AC | PRN
  Administered 2024-12-29: 75 mL via INTRAVENOUS

## 2024-12-29 MED ORDER — OMEPRAZOLE 20 MG PO CPDR
40.0000 mg | DELAYED_RELEASE_CAPSULE | Freq: Every day | ORAL | Status: DC
  Administered 2024-12-29: 40 mg via ORAL
  Filled 2024-12-29: qty 2

## 2024-12-29 MED ORDER — ASPIRIN 81 MG PO TBEC
81.0000 mg | DELAYED_RELEASE_TABLET | Freq: Every day | ORAL | 0 refills | Status: AC
  Filled 2024-12-29: qty 30, 30d supply, fill #0

## 2024-12-29 MED ORDER — OMEPRAZOLE MAGNESIUM 20 MG PO TBEC: 40.0000 mg | DELAYED_RELEASE_TABLET | Freq: Every day | ORAL | Status: AC

## 2024-12-29 NOTE — Discharge Summary (Signed)
 Physician Discharge Summary  Ernest Franco DOB: 03-06-1971 DOA: 12/27/2024  PCP: Duwayne Katheryn HERO, PA  Admit date: 12/27/2024 Discharge date: 12/29/2024  Admitted From: (Home) Disposition:  (Home)  Recommendations for Outpatient Follow-up:  Follow up with PCP in 1-2 weeks Please obtain BMP/CBC in one week    Diet recommendation: Heart Healthy  Brief/Interim Summary:  Ernest Franco is a 53 y.o. male with past medical history  of  obesity with bmi of 45.19, diabetes mellitus type 2, history of secondhand tobacco exposure from both parents during childhood, history of neck surgery coming from [facility] for slurred speech, dizziness and feeling off balance, so transferred to [facility] for TIA versus CVA workup.  Apparently he was recently started on Geodon and symptoms developed after he took his first dose.  Slurred speech speech, dizziness and unsteady gait secondary to Geodon medication effect TIA ruled out - This is secondary to medication effect, from Geodon it was discontinued on discharge. - Neurology input greatly appreciated, visual workup was for TIA versus Geodon medication effect, recommendation for TIA workup, 2D echo with no acute findings, LDL mildly elevated, A1c within normal limit, no acute needs by PT and OT, MRI with no evidence of acute CVA (was obtained under anesthesia due to severe claustrophobia)  Diabetes mellitus type 2 well controlled with A1c of 5 - Controlled, with A1c of 5, continue with Mounjaro  Depression - Continue home medications, but instructed to hold Geodon  Hyperlipidemia  with LDL of 111, started on Crestor, continue with fish oil  Morbid obesity - Body mass index is 45.19 kg/m. - Continue with Mounjaro   Discharge Diagnoses:  Principal Problem:   Speech disturbance    Discharge Instructions  Discharge Instructions     Increase activity slowly   Complete by: As directed       Allergies as of 12/29/2024        Reactions   Glucophage  [metformin ] Diarrhea   Other Nausea And Vomiting   One instance of vomiting after general anesthesia, once.        Medication List     STOP taking these medications    ziprasidone 20 MG capsule Commonly known as: GEODON       TAKE these medications    busPIRone 10 MG tablet Commonly known as: BUSPAR Take 10 mg by mouth See admin instructions. Take 1 tablet (10mg ) by mouth at bedtime and up to three times daily as needed for anxiety.   DULoxetine 60 MG capsule Commonly known as: CYMBALTA Take 60 mg by mouth 2 (two) times daily.   MEGARED ADVANCED OMEGA-3 PO Take 1 capsule by mouth daily.   Mounjaro 10 MG/0.5ML Pen Generic drug: tirzepatide Inject 10 mg into the skin every Monday.   omeprazole  20 MG tablet Commonly known as: PRILOSEC  OTC Take 2 tablets (40 mg total) by mouth daily.        Allergies[1]  Consultations: Neurology   Procedures/Studies: ECHOCARDIOGRAM COMPLETE Result Date: 12/29/2024    ECHOCARDIOGRAM REPORT   Patient Name:   Ernest Franco Date of Exam: 12/29/2024 Medical Rec #:  [id number]     Height:       66.0 in Accession #:    [id number]    Weight:       280.0 lb Date of Birth:  [birth year]      BSA:          2.307 m Patient Age:    53 years  BP:           140/80 mmHg Patient Gender: M             HR:           108 bpm. Exam Location:  Inpatient Procedure: 2D Echo, Color Doppler and Cardiac Doppler (Both Spectral and Color            Flow Doppler were utilized during procedure). Indications:    Stroke I63.9  History:        Patient has no prior history of Echocardiogram examinations.                 Risk Factors:Hypertension and Diabetes.  Sonographer:    Tinnie Gosling RDCS Referring Phys: [phone] EKTA V PATEL IMPRESSIONS  1. Left ventricular ejection fraction, by estimation, is 60 to 65%. The left ventricle has normal function. The left ventricle has no regional wall motion abnormalities. Left ventricular diastolic  parameters were normal.  2. Right ventricular systolic function is normal. The right ventricular size is normal.  3. The mitral valve is normal in structure. No evidence of mitral valve regurgitation. No evidence of mitral stenosis.  4. The aortic valve is normal in structure. Aortic valve regurgitation is not visualized. No aortic stenosis is present.  5. The inferior vena cava is normal in size with greater than 50% respiratory variability, suggesting right atrial pressure of 3 mmHg. FINDINGS  Left Ventricle: Left ventricular ejection fraction, by estimation, is 60 to 65%. The left ventricle has normal function. The left ventricle has no regional wall motion abnormalities. The left ventricular internal cavity size was normal in size. There is  no left ventricular hypertrophy. Left ventricular diastolic parameters were normal. Right Ventricle: The right ventricular size is normal. No increase in right ventricular wall thickness. Right ventricular systolic function is normal. Left Atrium: Left atrial size was normal in size. Right Atrium: Right atrial size was normal in size. Pericardium: There is no evidence of pericardial effusion. Mitral Valve: The mitral valve is normal in structure. No evidence of mitral valve regurgitation. No evidence of mitral valve stenosis. Tricuspid Valve: The tricuspid valve is normal in structure. Tricuspid valve regurgitation is not demonstrated. No evidence of tricuspid stenosis. Aortic Valve: The aortic valve is normal in structure. Aortic valve regurgitation is not visualized. No aortic stenosis is present. Pulmonic Valve: The pulmonic valve was normal in structure. Pulmonic valve regurgitation is not visualized. No evidence of pulmonic stenosis. Aorta: The aortic root is normal in size and structure. Venous: The inferior vena cava is normal in size with greater than 50% respiratory variability, suggesting right atrial pressure of 3 mmHg. IAS/Shunts: The interatrial septum was not  well visualized.  LEFT VENTRICLE PLAX 2D LVIDd:         4.40 cm   Diastology LVIDs:         2.00 cm   LV e' medial:  9.36 cm/s LV PW:         1.20 cm   LV e' lateral: 10.10 cm/s LV IVS:        1.20 cm LVOT diam:     2.20 cm LV SV:         71 LV SV Index:   31 LVOT Area:     3.80 cm  RIGHT VENTRICLE         IVC TAPSE (M-mode): 2.4 cm  IVC diam: 1.50 cm LEFT ATRIUM           Index LA  diam:      2.90 cm 1.26 cm/m LA Vol (A4C): 42.2 ml 18.29 ml/m  AORTIC VALVE LVOT Vmax:   85.00 cm/s LVOT Vmean:  61.100 cm/s LVOT VTI:    0.186 m  AORTA Ao Root diam: 2.90 cm Ao Asc diam:  3.00 cm  SHUNTS Systemic VTI:  0.19 m Systemic Diam: 2.20 cm Ernest Franco Electronically signed by Ernest Franco Signature Date/Time: 12/29/2024/1:17:42 PM    Final    MR BRAIN WO CONTRAST Result Date: 12/29/2024 EXAM: MRI BRAIN WITHOUT CONTRAST 12/28/2024 02:44:28 PM TECHNIQUE: Multiplanar multisequence MRI of the head/brain was performed without the administration of intravenous contrast. COMPARISON: CT angiogram of the head and neck dated 12/27/2024. CLINICAL HISTORY: Stroke, follow up, past medical history significant for obesity (BMI 45.19), type 2 diabetes mellitus, secondhand tobacco exposure during childhood from both parents, and a history of neck surgery, presenting from [facility]. FINDINGS: BRAIN AND VENTRICLES: No acute infarct. No intracranial hemorrhage. No mass. No midline shift. No hydrocephalus. Mild subcortical cerebral white matter disease present. The sella is unremarkable. Normal flow voids. ORBITS: No acute abnormality. SINUSES AND MASTOIDS: No acute abnormality. BONES AND SOFT TISSUES: Normal marrow signal. No acute soft tissue abnormality. IMPRESSION: 1. No acute intracranial abnormality. 2. Mild subcortical cerebral white matter disease. Electronically signed by: Evalene Coho MD 12/29/2024 04:41 AM EST RP Workstation: [id number]   CT Angio Chest PE W and/or Wo Contrast Result Date: 12/29/2024 EXAM: CTA CHEST  12/29/2024 01:29:42 AM TECHNIQUE: CTA of the chest was performed after the administration of intravenous contrast. Multiplanar reformatted images are provided for review. MIP images are provided for review. Automated exposure control, iterative reconstruction, and/or weight based adjustment of the mA/kV was utilized to reduce the radiation dose to as low as reasonably achievable. COMPARISON: None available. CLINICAL HISTORY: Pulmonary embolism (PE) suspected, high prob. FINDINGS: PULMONARY ARTERIES: Pulmonary arteries are adequately opacified for evaluation. No acute pulmonary embolus. Central pulmonary arteries are of normal caliber. MEDIASTINUM: The heart demonstrates moderate coronary artery calcification and global cardiac size within normal limits. No pericardial effusion. There is mild atherosclerotic calcification within the thoracic aorta. No aortic aneurysm. LYMPH NODES: No mediastinal, hilar or axillary lymphadenopathy. LUNGS AND PLEURA: The lungs are without acute process. No focal consolidation or pulmonary edema. No evidence of pleural effusion or pneumothorax. UPPER ABDOMEN: Small hiatal hernia. Surgical changes of gastric sleeve resection identified. Status post cholecystectomy. SOFT TISSUES AND BONES: No acute bone or soft tissue abnormality. IMPRESSION: 1. No pulmonary embolism. 2. Moderate coronary artery calcification. 3. Small hiatal hernia. 4. Postsurgical changes of gastric sleeve resection and prior cholecystectomy. 5. Raf score includes aortic atherosclerosis (ICD10-I70.0). Electronically signed by: Dorethia Molt MD 12/29/2024 03:03 AM EST RP Workstation: [id number]   CT ANGIO HEAD NECK W WO CM Result Date: 12/27/2024 EXAM: CTA Head and Neck with Intravenous Contrast. CT Head without Contrast. CLINICAL HISTORY: Neuro deficit, acute, stroke suspected TECHNIQUE: Axial CTA images of the head and neck performed with intravenous contrast. MIP reconstructed images were created and reviewed.  Axial computed tomography images of the head/brain performed without intravenous contrast. Note: Per PQRS, the description of internal carotid artery percent stenosis, including 0 percent or normal exam, is based on [employer] American Symptomatic Carotid Endarterectomy Trial (NASCET) criteria. Dose reduction technique was used including one or more of the following: automated exposure control, adjustment of mA and kV according to patient size, and/or iterative reconstruction. CONTRAST: 75 mL Omnipaque  COMPARISON: CTA head/neck 10/03/2016. FINDINGS: CT HEAD: CT head is limited by  beam hardening artifact. Within this limitation: BRAIN: No acute intraparenchymal hemorrhage. No mass lesion. No CT evidence for acute territorial infarct. No midline shift or extra-axial collection. VENTRICLES: No hydrocephalus. ORBITS: The orbits are unremarkable. SINUSES AND MASTOIDS: The paranasal sinuses and mastoid air cells are clear. CTA NECK: COMMON CAROTID ARTERIES: No significant stenosis. No dissection or occlusion. INTERNAL CAROTID ARTERIES: No stenosis by NASCET criteria. No dissection or occlusion. VERTEBRAL ARTERIES: No significant stenosis. No dissection or occlusion. CTA HEAD: ANTERIOR CEREBRAL ARTERIES: No significant stenosis. No occlusion. No aneurysm. MIDDLE CEREBRAL ARTERIES: No significant stenosis. No occlusion. No aneurysm. POSTERIOR CEREBRAL ARTERIES: Bilateral fetal type PCAs with small vertebrobasilar system, anatomic variant. No significant stenosis. No occlusion. No aneurysm. BASILAR ARTERY: No significant stenosis. No occlusion. No aneurysm. OTHER: SOFT TISSUES: No acute finding. No masses or lymphadenopathy. BONES: No acute osseous abnormality. IMPRESSION: 1. No large vessel occlusion or significant stenosis. 2. No evidence of acute intracranial abnormality on limited CT head. Electronically signed by: Gilmore Molt 12/27/2024 11:00 PM EST RP Workstation: [id number]   DG Chest Port 1 View Result Date:  12/27/2024 EXAM: 1 VIEW(S) XRAY OF THE CHEST 12/27/2024 08:22:00 PM COMPARISON: CXR 10/03/2016. CLINICAL HISTORY: AMS, confusion. FINDINGS: LUNGS AND PLEURA: No focal pulmonary opacity. No pleural effusion. No pneumothorax. HEART AND MEDIASTINUM: Mild aortic arch calcification. BONES AND SOFT TISSUES: No acute osseous abnormality. IMPRESSION: 1. No acute findings. Electronically signed by: Morgane Naveau MD 12/27/2024 09:04 PM EST RP Workstation: [id number]     Subjective:  He denies any complaints this morning, denies any speech problem, dizziness lightheadedness or any focal deficits. Discharge Exam: Vitals:   12/29/24 0609 12/29/24 0800  BP: 125/75 (!) 140/80  Pulse: 87   Resp: 19   Temp: 97.6 F (36.4 C) 97.6 F (36.4 C)  SpO2: 95%    Vitals:   12/28/24 2046 12/29/24 0005 12/29/24 0609 12/29/24 0800  BP: 137/77 130/78 125/75 (!) 140/80  Pulse: (!) 102 99 87   Resp: 19 15 19    Temp: 98.4 F (36.9 C) 98.2 F (36.8 C) 97.6 F (36.4 C) 97.6 F (36.4 C)  TempSrc: Oral Oral Oral Oral  SpO2: 94% 94% 95%   Weight:      Height:        General: Pt is alert, awake, not in acute distress Cardiovascular: RRR, S1/S2 +, no rubs, no gallops Respiratory: CTA bilaterally, no wheezing, no rhonchi Abdominal: Soft, NT, ND, bowel sounds + Extremities: no edema, no cyanosis    The results of significant diagnostics from this hospitalization (including imaging, microbiology, ancillary and laboratory) are listed below for reference.     Microbiology: Recent Results (from the past 240 hours)  Resp panel by RT-PCR (RSV, Flu A&B, Covid) Anterior Nasal Swab     Status: None   Collection Time: 12/27/24  8:15 PM   Specimen: Anterior Nasal Swab  Result Value Ref Range Status   SARS Coronavirus 2 by RT PCR NEGATIVE NEGATIVE Final    Comment: (NOTE) SARS-CoV-2 target nucleic acids are NOT DETECTED.  The SARS-CoV-2 RNA is generally detectable in upper respiratory specimens during the acute  phase of infection. The lowest concentration of SARS-CoV-2 viral copies this assay can detect is 138 copies/mL. A negative result does not preclude SARS-Cov-2 infection and should not be used as the sole basis for treatment or other patient management decisions. A negative result may occur with  improper specimen collection/handling, submission of specimen other than nasopharyngeal swab, presence of viral mutation(s) within the areas targeted  by this assay, and inadequate number of viral copies(<138 copies/mL). A negative result must be combined with clinical observations, patient history, and epidemiological information. The expected result is Negative.  Fact Sheet for Patients:  bloggercourse.com  Fact Sheet for Healthcare Providers:  seriousbroker.it  This test is no t yet approved or cleared by the [place]  FDA and  has been authorized for detection and/or diagnosis of SARS-CoV-2 by FDA under an Emergency Use Authorization (EUA). This EUA will remain  in effect (meaning this test can be used) for the duration of the COVID-19 declaration under Section 564(b)(1) of the Act, 21 U.S.C.section 360bbb-3(b)(1), unless the authorization is terminated  or revoked sooner.       Influenza A by PCR NEGATIVE NEGATIVE Final   Influenza B by PCR NEGATIVE NEGATIVE Final    Comment: (NOTE) The Xpert Xpress SARS-CoV-2/FLU/RSV plus assay is intended as an aid in the diagnosis of influenza from Nasopharyngeal swab specimens and should not be used as a sole basis for treatment. Nasal washings and aspirates are unacceptable for Xpert Xpress SARS-CoV-2/FLU/RSV testing.  Fact Sheet for Patients: bloggercourse.com  Fact Sheet for Healthcare Providers: seriousbroker.it  This test is not yet approved or cleared by the [place]  FDA and has been authorized for detection and/or diagnosis of  SARS-CoV-2 by FDA under an Emergency Use Authorization (EUA). This EUA will remain in effect (meaning this test can be used) for the duration of the COVID-19 declaration under Section 564(b)(1) of the Act, 21 U.S.C. section 360bbb-3(b)(1), unless the authorization is terminated or revoked.     Resp Syncytial Virus by PCR NEGATIVE NEGATIVE Final    Comment: (NOTE) Fact Sheet for Patients: bloggercourse.com  Fact Sheet for Healthcare Providers: seriousbroker.it  This test is not yet approved or cleared by the [place]  FDA and has been authorized for detection and/or diagnosis of SARS-CoV-2 by FDA under an Emergency Use Authorization (EUA). This EUA will remain in effect (meaning this test can be used) for the duration of the COVID-19 declaration under Section 564(b)(1) of the Act, 21 U.S.C. section 360bbb-3(b)(1), unless the authorization is terminated or revoked.  Performed at [facility], [address] [place], [place] [address]      Labs: BNP (last 3 results) No results for input(s): BNP in the last 8760 hours. Basic Metabolic Panel: Recent Labs  Lab 12/27/24 2012  NA 140  K 3.7  CL 107  CO2 24  GLUCOSE 126*  BUN 14  CREATININE 0.87  CALCIUM  8.1*   Liver Function Tests: Recent Labs  Lab 12/27/24 2012  AST 18  ALT 13  ALKPHOS 54  BILITOT 0.2  PROT 5.4*  ALBUMIN 3.5   No results for input(s): LIPASE, AMYLASE in the last 168 hours. No results for input(s): AMMONIA in the last 168 hours. CBC: Recent Labs  Lab 12/27/24 2012  WBC 8.0  NEUTROABS 5.1  HGB 14.7  HCT 41.2  MCV 86.4  PLT 188   Cardiac Enzymes: No results for input(s): CKTOTAL, CKMB, CKMBINDEX, TROPONINI in the last 168 hours. BNP: Invalid input(s): POCBNP CBG: Recent Labs  Lab 12/28/24 2012 12/29/24 0027 12/29/24 0429 12/29/24 0804 12/29/24 1202  GLUCAP 219* 148* 143* 107* 103*    D-Dimer Recent Labs    12/28/24 1319  DDIMER 1.16*   Hgb A1c Recent Labs    12/28/24 1319  HGBA1C 5.0   Lipid Profile Recent Labs    12/29/24 0325  CHOL 168  HDL 37*  LDLCALC 111*  TRIG 100  CHOLHDL 4.6   Thyroid function studies Recent Labs    12/28/24 1319  TSH 3.520   Anemia work up No results for input(s): VITAMINB12, FOLATE, FERRITIN, TIBC, IRON, RETICCTPCT in the last 72 hours. Urinalysis    Component Value Date/Time   COLORURINE YELLOW 12/27/2024 2149   APPEARANCEUR CLEAR 12/27/2024 2149   LABSPEC 1.015 12/27/2024 2149   PHURINE 6.0 12/27/2024 2149   GLUCOSEU NEGATIVE 12/27/2024 2149   HGBUR NEGATIVE 12/27/2024 2149   BILIRUBINUR NEGATIVE 12/27/2024 2149   KETONESUR NEGATIVE 12/27/2024 2149   PROTEINUR NEGATIVE 12/27/2024 2149   NITRITE NEGATIVE 12/27/2024 2149   LEUKOCYTESUR NEGATIVE 12/27/2024 2149   Sepsis Labs Recent Labs  Lab 12/27/24 2012  WBC 8.0   Microbiology Recent Results (from the past 240 hours)  Resp panel by RT-PCR (RSV, Flu A&B, Covid) Anterior Nasal Swab     Status: None   Collection Time: 12/27/24  8:15 PM   Specimen: Anterior Nasal Swab  Result Value Ref Range Status   SARS Coronavirus 2 by RT PCR NEGATIVE NEGATIVE Final    Comment: (NOTE) SARS-CoV-2 target nucleic acids are NOT DETECTED.  The SARS-CoV-2 RNA is generally detectable in upper respiratory specimens during the acute phase of infection. The lowest concentration of SARS-CoV-2 viral copies this assay can detect is 138 copies/mL. A negative result does not preclude SARS-Cov-2 infection and should not be used as the sole basis for treatment or other patient management decisions. A negative result may occur with  improper specimen collection/handling, submission of specimen other than nasopharyngeal swab, presence of viral mutation(s) within the areas targeted by this assay, and inadequate number of viral copies(<138 copies/mL). A negative result  must be combined with clinical observations, patient history, and epidemiological information. The expected result is Negative.  Fact Sheet for Patients:  bloggercourse.com  Fact Sheet for Healthcare Providers:  seriousbroker.it  This test is no t yet approved or cleared by the [place]  FDA and  has been authorized for detection and/or diagnosis of SARS-CoV-2 by FDA under an Emergency Use Authorization (EUA). This EUA will remain  in effect (meaning this test can be used) for the duration of the COVID-19 declaration under Section 564(b)(1) of the Act, 21 U.S.C.section 360bbb-3(b)(1), unless the authorization is terminated  or revoked sooner.       Influenza A by PCR NEGATIVE NEGATIVE Final   Influenza B by PCR NEGATIVE NEGATIVE Final    Comment: (NOTE) The Xpert Xpress SARS-CoV-2/FLU/RSV plus assay is intended as an aid in the diagnosis of influenza from Nasopharyngeal swab specimens and should not be used as a sole basis for treatment. Nasal washings and aspirates are unacceptable for Xpert Xpress SARS-CoV-2/FLU/RSV testing.  Fact Sheet for Patients: bloggercourse.com  Fact Sheet for Healthcare Providers: seriousbroker.it  This test is not yet approved or cleared by the [place]  FDA and has been authorized for detection and/or diagnosis of SARS-CoV-2 by FDA under an Emergency Use Authorization (EUA). This EUA will remain in effect (meaning this test can be used) for the duration of the COVID-19 declaration under Section 564(b)(1) of the Act, 21 U.S.C. section 360bbb-3(b)(1), unless the authorization is terminated or revoked.     Resp Syncytial Virus by PCR NEGATIVE NEGATIVE Final    Comment: (NOTE) Fact Sheet for Patients: bloggercourse.com  Fact Sheet for Healthcare Providers: seriousbroker.it  This test is  not yet approved or cleared by the [place]  FDA and has been authorized for detection and/or diagnosis of SARS-CoV-2 by FDA  under an Emergency Use Authorization (EUA). This EUA will remain in effect (meaning this test can be used) for the duration of the COVID-19 declaration under Section 564(b)(1) of the Act, 21 U.S.C. section 360bbb-3(b)(1), unless the authorization is terminated or revoked.  Performed at [facility], [address] [place], [place] [address]      Time coordinating discharge: Over 30 minutes  SIGNED:   Brayton Lye, MD  [facility] 12/29/2024, 2:34 PM Pager   If 7PM-7AM, please contact night-coverage [email or web address] Password [email or web address]    [1]  Allergies Allergen Reactions   Glucophage  [Metformin ] Diarrhea   Other Nausea And Vomiting    One instance of vomiting after general anesthesia, once.

## 2024-12-29 NOTE — Progress Notes (Addendum)
 Discharge   Patient expressed verbal understanding of discharge POC.   Patient given time to ask any questions.  Additional education included in AVS.  Alert oriented in good spirits.   Tele and 2 PIV removed. Pressure dressings intact.  All personal belongings at bedtime.   Per Primary RN Heron, blood thinner held, patient ambulating down the hallway independently.  TOC  meds ready.  Discharging to Main A.  Wife at bedside.

## 2024-12-29 NOTE — Progress Notes (Signed)
" °  Echocardiogram 2D Echocardiogram has been performed.  Tinnie FORBES Gosling RDCS 12/29/2024, 9:54 AM "

## 2024-12-29 NOTE — Progress Notes (Signed)
 Pt is transferred back to his room after CT chest. No acute distress noted.  Wendi Dash, RN

## 2024-12-29 NOTE — Evaluation (Signed)
 Physical Therapy Evaluation and DISCHARGE Patient Details Name: Ernest Franco MRN: [id number] DOB: 07-28-1971 Today's Date: 12/29/2024  History of Present Illness  53 y.o male presents to [facility] on 12/28 with reports of slurred speech and dizziness. Has been taking OTC cough medicine. MRI (-). PMH: DM, THN.  Clinical Impression  Pt seen today for PT evaluation due to reported dizziness and difficulty with speech at admission. MRI (-) and symptoms have since resolved. Pt independent with ambulation and demonstrates no LOB throughout mobility. No reports of pain or N/T to limit mobility at this time. No dizziness with position changes during session. Pt completes romberg stance, tandem stance, and SLS without LOB or postural sway despite decreased BOS. Pt and family have no further PT concerns at this time. Pt has no further PT needs, will sign off at this time. Re-consult as needed if mobility needs change.         If plan is discharge home, recommend the following:     Can travel by private vehicle        Equipment Recommendations None recommended by PT  Recommendations for Other Services       Functional Status Assessment Patient has not had a recent decline in their functional status     Precautions / Restrictions Precautions Precautions: Fall Recall of Precautions/Restrictions: Intact Restrictions Weight Bearing Restrictions Per Provider Order: No      Mobility  Bed Mobility               General bed mobility comments: Pt standing upon arrival.    Transfers Overall transfer level: Independent Equipment used: None               General transfer comment: Pt demonstrates good eccentric control upon returning to sit EOB at end of assessment. No dizziness with position changes.    Ambulation/Gait Ambulation/Gait assistance: Independent Gait Distance (Feet): 30 Feet Assistive device: None Gait Pattern/deviations: WFL(Within Functional Limits), Wide base of  support   Gait velocity interpretation: >2.62 ft/sec, indicative of community ambulatory   General Gait Details: No gait concerns noted at this time, pt steady throughout ambulation. Tolerates well.  Stairs            Wheelchair Mobility     Tilt Bed    Modified Rankin (Stroke Patients Only)       Balance Overall balance assessment: Independent (Pt able to hold romberg stance, tandem stance, and SLS for 30s eyes open and closed without balance concerns.)                                           Pertinent Vitals/Pain Pain Assessment Pain Assessment: No/denies pain    Home Living Family/patient expects to be discharged to:: Private residence Living Arrangements: Spouse/significant other Available Help at Discharge: Family;Available 24 hours/day Type of Home: House Home Access: Stairs to enter   [employer] of Steps: 3   Home Layout: One level        Prior Function Prior Level of Function : Independent/Modified Independent;Driving             Mobility Comments: Does not use DME at baseline. ADLs Comments: Pt independent with all ADLs, works as naval architect.     Extremity/Trunk Assessment   Upper Extremity Assessment Upper Extremity Assessment: Defer to OT evaluation    Lower Extremity Assessment Lower Extremity Assessment: Overall [facility]  for tasks assessed    Cervical / Trunk Assessment Cervical / Trunk Assessment: Normal  Communication   Communication Communication: No apparent difficulties    Cognition Arousal: Alert Behavior During Therapy: WFL for tasks assessed/performed   PT - Cognitive impairments: No apparent impairments                         Following commands: Intact       Cueing Cueing Techniques: Verbal cues, Visual cues     General Comments General comments (skin integrity, edema, etc.): VSS throughout. No significant skin abnormalities noted.    Exercises     Assessment/Plan     PT Assessment Patient does not need any further PT services  PT Problem List         PT Treatment Interventions      PT Goals (Current goals can be found in the Care Plan section)  Acute Rehab PT Goals Patient Stated Goal: Go home PT Goal Formulation: With patient/family Time For Goal Achievement: 01/12/25 Potential to Achieve Goals: Good    Frequency       Co-evaluation               AM-PAC PT 6 Clicks Mobility  Outcome Measure Help needed turning from your back to your side while in a flat bed without using bedrails?: None Help needed moving from lying on your back to sitting on the side of a flat bed without using bedrails?: None Help needed moving to and from a bed to a chair (including a wheelchair)?: None Help needed standing up from a chair using your arms (e.g., wheelchair or bedside chair)?: None Help needed to walk in hospital room?: None Help needed climbing 3-5 steps with a railing? : None 6 Click Score: 24    End of Session   Activity Tolerance: Patient tolerated treatment well Patient left: in bed;with call bell/phone within reach;with family/visitor present Nurse Communication: Mobility status PT Visit Diagnosis: Other symptoms and signs involving the nervous system (R29.898)    Time: 1030-1040 PT Time Calculation (min) (ACUTE ONLY): 10 min   Charges:   PT Evaluation $PT Eval Low Complexity: 1 Low   PT General Charges $$ ACUTE PT VISIT: 1 Visit         Ernest Franco, PT, DPT  Acute Rehabilitation Services         Office: [phone]     Ernest Franco 12/29/2024, 10:54 AM

## 2024-12-29 NOTE — Progress Notes (Signed)
 SLP Cancellation Note  Patient Details Name: Ernest Franco MRN: [id number] DOB: 15-Jul-1971   Cancelled treatment:       Reason Eval/Treat Not Completed: SLP screened, no needs identified, will sign off   Yisrael Obryan, Consuelo Fitch 12/29/2024, 9:37 AM

## 2024-12-29 NOTE — Progress Notes (Signed)
 Pt is alert an d fully oriented x 4, no neurological deficits per NIHSS q 4 hrs as documented below. He is stable hemodynamically, afebrile, normal respiratory effort, no acute distress noted. Pt is able to rest and sleep well with no major complaints. Plan of care is reviewed. Pt has been progressing. We will continue to  monitor.     12/29/24 0400  Glasgow Coma Scale  Eye Opening 4  Best Verbal Response (NON-intubated) 5  Best Motor Response 6  Glasgow Coma Scale Score 15  NIH Stroke Scale   Dizziness Present No  Headache Present No  Interval Shift assessment  Level of Consciousness (1a.)    0  LOC Questions (1b. )    0  LOC Commands (1c. )    0  Best Gaze (2. )   0  Visual (3. )   0  Facial Palsy (4. )     0  Motor Arm, Left (5a. )    0  Motor Arm, Right (5b. )  0  Motor Leg, Left (6a. )   0  Motor Leg, Right (6b. )  0  Limb Ataxia (7. ) 0  Sensory (8. )   0  Best Language (9. )   0  Dysarthria (10. ) 0  Extinction/Inattention (11.)    0  Complete NIHSS TOTAL 0     Wendi Dash, RN

## 2024-12-29 NOTE — Progress Notes (Signed)
 OT Cancellation Note  Patient Details Name: Ernest Franco MRN: [id number] DOB: 1971/01/18   Cancelled Treatment:    Reason Eval/Treat Not Completed: OT screened, no needs identified, will sign off  [name] 12/29/2024, 1:23 PM 12/29/2024  RP, OTR/L  Acute Rehabilitation Services  Office:  [phone]

## 2024-12-29 NOTE — TOC CM/SW Note (Signed)
 Transition of Care [facility]) - Inpatient Brief Assessment   Patient Details  Name: Ernest Franco MRN: [id number] Date of Birth: 03/15/1971  [facility] CM/SW Contact:    Andrez JULIANNA George, RN Phone Number: 12/29/2024, 1:32 PM   Clinical Narrative:  LCSW has added counseling resources to AVS.  For any further IP Care management needs please place consult.   Transition of Care Asessment: Insurance and Status: Insurance coverage has been reviewed Patient has primary care physician: Yes Home environment has been reviewed: home with spouse   Prior/Current Home Services: No current home services Social Drivers of Health Review: SDOH reviewed no interventions necessary Readmission risk has been reviewed: Yes Transition of care needs: transition of care needs identified, TOC will continue to follow

## 2024-12-29 NOTE — Progress Notes (Signed)
 Pt is out off the floor for CT angio chest and pulmonary. Vital signs stable.   Wendi Dash, RN

## 2024-12-29 NOTE — TOC Transition Note (Signed)
 Transition of Care [facility]) - Discharge Note   Patient Details  Name: Ernest Franco MRN: [id number] Date of Birth: [birth year]  Transition of [facility]) CM/SW Contact:  Andrez JULIANNA George, RN Phone Number: 12/29/2024, 3:09 PM   Clinical Narrative:     Pt is discharging home with self care. Pt has transportation home.  Final next level of care: Home/Self Care Barriers to Discharge: No Barriers Identified   Patient Goals and CMS Choice            Discharge Placement                       Discharge Plan and Services Additional resources added to the After Visit Summary for                                       Social Drivers of Health (SDOH) Interventions SDOH Screenings   Food Insecurity: No Food Insecurity (12/28/2024)  Housing: Low Risk (12/28/2024)  Transportation Needs: No Transportation Needs (12/28/2024)  Utilities: Not At Risk (12/28/2024)  Social Connections: Unknown (05/14/2022)   Received from [facility]  Tobacco Use: Low Risk (12/28/2024)     Readmission Risk Interventions     No data to display
